# Patient Record
Sex: Female | Born: 1985 | Race: White | Hispanic: No | Marital: Married | Smoking: Never smoker
Health system: Southern US, Community
[De-identification: ages and names within clinical notes are randomized; demographics above are authoritative.]

## PROBLEM LIST (undated history)

## (undated) ENCOUNTER — Ambulatory Visit: Source: Home / Self Care

## (undated) DIAGNOSIS — T7840XA Allergy, unspecified, initial encounter: Secondary | ICD-10-CM

## (undated) DIAGNOSIS — F329 Major depressive disorder, single episode, unspecified: Secondary | ICD-10-CM

## (undated) DIAGNOSIS — Z789 Other specified health status: Secondary | ICD-10-CM

## (undated) DIAGNOSIS — F32A Depression, unspecified: Secondary | ICD-10-CM

## (undated) HISTORY — PX: NO PAST SURGERIES: SHX2092

## (undated) HISTORY — DX: Allergy, unspecified, initial encounter: T78.40XA

## (undated) HISTORY — DX: Major depressive disorder, single episode, unspecified: F32.9

## (undated) HISTORY — DX: Depression, unspecified: F32.A

---

## 2012-08-19 ENCOUNTER — Ambulatory Visit (INDEPENDENT_AMBULATORY_CARE_PROVIDER_SITE_OTHER): Payer: Self-pay | Admitting: Obstetrics & Gynecology

## 2012-08-19 ENCOUNTER — Encounter: Payer: Self-pay | Admitting: Obstetrics & Gynecology

## 2012-08-19 VITALS — BP 124/77 | HR 80 | Temp 96.8°F | Ht 63.0 in | Wt 290.8 lb

## 2012-08-19 DIAGNOSIS — N83209 Unspecified ovarian cyst, unspecified side: Secondary | ICD-10-CM

## 2012-08-19 NOTE — Patient Instructions (Addendum)
We will call you with the results and further management.  Ovarian Cyst The ovaries are small organs that are on each side of the uterus. The ovaries are the organs that produce the female hormones, estrogen and progesterone. An ovarian cyst is a sac filled with fluid that can vary in its size. It is normal for a small cyst to form in women who are in the childbearing age and who have menstrual periods. This type of cyst is called a follicle cyst that becomes an ovulation cyst (corpus luteum cyst) after it produces the women's egg. It later goes away on its own if the woman does not become pregnant. There are other kinds of ovarian cysts that may cause problems and may need to be treated. The most serious problem is a cyst with cancer. It should be noted that menopausal women who have an ovarian cyst are at a higher risk of it being a cancer cyst. They should be evaluated very quickly, thoroughly and followed closely. This is especially true in menopausal women because of the high rate of ovarian cancer in women in menopause. CAUSES AND TYPES OF OVARIAN CYSTS:  FUNCTIONAL CYST: The follicle/corpus luteum cyst is a functional cyst that occurs every month during ovulation with the menstrual cycle. They go away with the next menstrual cycle if the woman does not get pregnant. Usually, there are no symptoms with a functional cyst.  ENDOMETRIOMA CYST: This cyst develops from the lining of the uterus tissue. This cyst gets in or on the ovary. It grows every month from the bleeding during the menstrual period. It is also called a "chocolate cyst" because it becomes filled with blood that turns brown. This cyst can cause pain in the lower abdomen during intercourse and with your menstrual period.  CYSTADENOMA CYST: This cyst develops from the cells on the outside of the ovary. They usually are not cancerous. They can get very big and cause lower abdomen pain and pain with intercourse. This type of cyst can twist  on itself, cut off its blood supply and cause severe pain. It also can easily rupture and cause a lot of pain.  DERMOID CYST: This type of cyst is sometimes found in both ovaries. They are found to have different kinds of body tissue in the cyst. The tissue includes skin, teeth, hair, and/or cartilage. They usually do not have symptoms unless they get very big. Dermoid cysts are rarely cancerous.  POLYCYSTIC OVARY: This is a rare condition with hormone problems that produces many small cysts on both ovaries. The cysts are follicle-like cysts that never produce an egg and become a corpus luteum. It can cause an increase in body weight, infertility, acne, increase in body and facial hair and lack of menstrual periods or rare menstrual periods. Many women with this problem develop type 2 diabetes. The exact cause of this problem is unknown. A polycystic ovary is rarely cancerous.  THECA LUTEIN CYST: Occurs when too much hormone (human chorionic gonadotropin) is produced and over-stimulates the ovaries to produce an egg. They are frequently seen when doctors stimulate the ovaries for invitro-fertilization (test tube babies).  LUTEOMA CYST: This cyst is seen during pregnancy. Rarely it can cause an obstruction to the birth canal during labor and delivery. They usually go away after delivery. SYMPTOMS   Pelvic pain or pressure.  Pain during sexual intercourse.  Increasing girth (swelling) of the abdomen.  Abnormal menstrual periods.  Increasing pain with menstrual periods.  You stop having menstrual  periods and you are not pregnant. DIAGNOSIS  The diagnosis can be made during:  Routine or annual pelvic examination (common).  Ultrasound.  X-ray of the pelvis.  CT Scan.  MRI.  Blood tests. TREATMENT   Treatment may only be to follow the cyst monthly for 2 to 3 months with your caregiver. Many go away on their own, especially functional cysts.  May be aspirated (drained) with a long  needle with ultrasound, or by laparoscopy (inserting a tube into the pelvis through a small incision).  The whole cyst can be removed by laparoscopy.  Sometimes the cyst may need to be removed through an incision in the lower abdomen.  Hormone treatment is sometimes used to help dissolve certain cysts.  Birth control pills are sometimes used to help dissolve certain cysts. HOME CARE INSTRUCTIONS  Follow your caregiver's advice regarding:  Medicine.  Follow up visits to evaluate and treat the cyst.  You may need to come back or make an appointment with another caregiver, to find the exact cause of your cyst, if your caregiver is not a gynecologist.  Get your yearly and recommended pelvic examinations and Pap tests.  Let your caregiver know if you have had an ovarian cyst in the past. SEEK MEDICAL CARE IF:   Your periods are late, irregular, they stop, or are painful.  Your stomach (abdomen) or pelvic pain does not go away.  Your stomach becomes larger or swollen.  You have pressure on your bladder or trouble emptying your bladder completely.  You have painful sexual intercourse.  You have feelings of fullness, pressure, or discomfort in your stomach.  You lose weight for no apparent reason.  You feel generally ill.  You become constipated.  You lose your appetite.  You develop acne.  You have an increase in body and facial hair.  You are gaining weight, without changing your exercise and eating habits.  You think you are pregnant. SEEK IMMEDIATE MEDICAL CARE IF:   You have increasing abdominal pain.  You feel sick to your stomach (nausea) and/or vomit.  You develop a fever that comes on suddenly.  You develop abdominal pain during a bowel movement.  Your menstrual periods become heavier than usual. Document Released: 07/29/2005 Document Revised: 10/21/2011 Document Reviewed: 06/01/2009 [facility] Patient Information 2013 [place], [place].

## 2012-08-19 NOTE — Progress Notes (Signed)
History:  27 y.o. G0P0000 here today for evaluation of 23 cm cyst diagnosed on imaging done at [facility], no report or images sent to [place]. She went there with pain last month and was given Percocet which alleviated her pain.  No pain since then, no other symptoms.  The following portions of the patient's history were reviewed and updated as appropriate: allergies, current medications, past family history, past medical history, past social history, past surgical history and problem list.  Review of Systems:  Pertinent items are noted in HPI.  Objective:  Physical Exam BP 124/77  Pulse 80  Temp 96.8 F (36 C) (Oral)  Ht 5\' 3"  (1.6 m)  Wt 290 lb 12.8 oz (131.906 kg)  BMI 51.51 kg/m2  LMP 07/26/2012 Gen: NAD, morbidly obese Abd: Soft, obese, nontender and nondistended.  Unable to palpate cyst due to habitus. Pelvic: Normal appearing external genitalia; normal appearing vaginal mucosa and cervix.  Normal discharge.  Unable to palpate pelvic organs or ovarian cyst due to habitus  Assessment & Plan:  Will attempt to obtain records from [facility] Pelvic ultrasound ordered; CA 125 checked Will follow up results and manage accordingly. Pain precautions reviewed.

## 2012-08-20 LAB — CA 125: CA 125: 17.3 U/mL (ref 0.0–30.2)

## 2012-08-24 ENCOUNTER — Telehealth: Payer: Self-pay

## 2012-08-24 NOTE — Telephone Encounter (Signed)
Message copied by Faythe Casa on Mon Aug 24, 2012  4:29 PM ------      Message from: Catherine Marks A      Created: Thu Aug 20, 2012  2:10 PM       Normal CA-125. Will await pelvic ultrasound results.  Please call to inform patient of results.

## 2012-08-25 NOTE — Telephone Encounter (Signed)
Pt informed

## 2012-08-26 ENCOUNTER — Ambulatory Visit (HOSPITAL_COMMUNITY)
Admission: RE | Admit: 2012-08-26 | Discharge: 2012-08-26 | Disposition: A | Discharge status: Home / Self Care | Payer: Self-pay | Source: Ambulatory Visit | Attending: Obstetrics & Gynecology | Admitting: Obstetrics & Gynecology

## 2012-08-26 ENCOUNTER — Encounter: Payer: Self-pay | Admitting: *Deleted

## 2012-08-26 DIAGNOSIS — N83209 Unspecified ovarian cyst, unspecified side: Secondary | ICD-10-CM | POA: Insufficient documentation

## 2012-08-27 ENCOUNTER — Telehealth: Payer: Self-pay

## 2012-08-27 NOTE — Telephone Encounter (Signed)
Message copied by Faythe Casa on Thu Aug 27, 2012 11:34 AM ------      Message from: Jaynie Collins A      Created: Thu Aug 27, 2012 11:03 AM       Pelvic ultrasound showed a 21 cm large left adnexal simple cystic structure not completely visualized, pelvic MRI recommended.  Pelvic MRI ordered; needs to be booked ASAP.  Patient needs to come back after her MRI to discuss findings and for surgical consultation.  Please call to inform patient of results and recommendations.

## 2012-08-27 NOTE — Telephone Encounter (Signed)
Called Catherine Marks and informed her of both appointments and copays. She voices understanding. States she is applying for medicaid.

## 2012-08-27 NOTE — Addendum Note (Signed)
Addended by: Jaynie Collins A on: 08/27/2012 11:05 AM   Modules accepted: Orders

## 2012-08-27 NOTE — Telephone Encounter (Signed)
Called and made an appointment for MRI at [facility] 09/01/12 at 1000- needs to arrive at 0945- make sure she does not any metal implants in her body. Also scheduled Surgical consult with Dr. Macon Large for 09/02/12 at 1:15 as overbook - need to tell patient she will need to pay $20 copay and may have to wait as we are working her in.

## 2012-08-27 NOTE — Progress Notes (Signed)
Quick Note:  Pelvic ultrasound showed a 21 cm large left adnexal simple cystic structure not completely visualized, pelvic MRI recommended. Pelvic MRI ordered; needs to be booked ASAP. Patient needs to come back after her MRI to discuss findings and for surgical consultation. Please call to inform patient of results and recommendations. ______

## 2012-09-01 ENCOUNTER — Ambulatory Visit (HOSPITAL_COMMUNITY)
Admission: RE | Admit: 2012-09-01 | Discharge: 2012-09-01 | Disposition: A | Discharge status: Home / Self Care | Payer: Self-pay | Source: Ambulatory Visit | Attending: Obstetrics & Gynecology | Admitting: Obstetrics & Gynecology

## 2012-09-01 ENCOUNTER — Other Ambulatory Visit: Payer: Self-pay | Admitting: Obstetrics & Gynecology

## 2012-09-01 DIAGNOSIS — N83209 Unspecified ovarian cyst, unspecified side: Secondary | ICD-10-CM | POA: Insufficient documentation

## 2012-09-01 DIAGNOSIS — D259 Leiomyoma of uterus, unspecified: Secondary | ICD-10-CM | POA: Insufficient documentation

## 2012-09-01 MED ORDER — GADOBENATE DIMEGLUMINE 529 MG/ML IV SOLN
20.0000 mL | Freq: Once | INTRAVENOUS | Status: AC
  Administered 2012-09-01: 20 mL via INTRAVENOUS

## 2012-09-02 ENCOUNTER — Encounter: Payer: Self-pay | Admitting: Obstetrics & Gynecology

## 2012-09-02 ENCOUNTER — Ambulatory Visit (INDEPENDENT_AMBULATORY_CARE_PROVIDER_SITE_OTHER): Payer: Self-pay | Admitting: Obstetrics & Gynecology

## 2012-09-02 VITALS — BP 127/80 | HR 69 | Ht 63.5 in | Wt 290.8 lb

## 2012-09-02 DIAGNOSIS — N83209 Unspecified ovarian cyst, unspecified side: Secondary | ICD-10-CM

## 2012-09-02 NOTE — Patient Instructions (Signed)
Unilateral Salpingo-Oophorectomy Unilateral salpingo-oophorectomy is the removal of one fallopian tube and ovary. The fallopian tubes transport the egg from the ovary to the womb (uterus). The fallopian tube is also where the sperm and egg meet and become fertilized and move down into the uterus.  Removing one tube and ovary will not:  Cause problems with your menstrual periods.  Cause problems with your sex drive (libido).  Put you into the menopause.  Give symptoms of the menopause.  Make you not able to get pregnant (sterile). There are several reasons for doing a salpingo-oophorectomy:  Infection of the tube and ovary.  There may be scar tissue of the tube and ovary (adhesions).  It may be necessary to remove the tube when the ovary has a cyst or tumor.  It may have to be done when removing the uterus.  It may have to be done when there is cancer of the tube or ovary. LET YOUR CAREGIVER KNOW ABOUT:  Allergies to food or medications.  All the medications you are taking including prescription and over-the-counter herbs, eye drops and creams.  If you are using illegal drugs or excessive alcohol.  Your smoking habits.  Previous problems with anesthesia including numbing medication.  The possibility of being pregnant.  History of blood clots or bleeding problems.  Previous surgery.  Any other medical or health problems. RISKS AND COMPLICATIONS  All surgery is associated with risks. Some of these risks are:  Injury to surrounding organs.  Bleeding.  Infection.  Blood clots in the legs or lungs.  Problems with the anesthesia.  The surgery does not help the problem.  Death. BEFORE THE PROCEDURE  Do not take aspirin or blood thinners because it can make you bleed.  Do not eat or drink anything at least 8 hours before the surgery.  Let your caregiver know if you develop a cold or an infection.  If you are being admitted the day of surgery, arrive at least  one hour before the surgery.  Arrange for help when you go home from the hospital.  If you smoke, do not smoke for at least 2 weeks before the surgery. PROCEDURE After being admitted to the hospital, you will change into a hospital gown. Then, you will be given an IV (intravenous) and a medication to relax you. Then, you will be put to sleep with an anesthetic. Any hair on your lower belly (abdomen) will be removed, and a catheter will be placed in your bladder. The fallopian tube and ovary will be removed either through 2 very small cuts (incisions) or through large incision in the lower abdomen. The blood vessels will be clamped and tied. AFTER THE PROCEDURE  You will be taken to the recovery room for 1 to 3 hours until your blood pressure, pulse and temperature are stable and you are waking up.  If you had a laparoscopy, you may be discharged in several hours.  If you had a large incision, you will be admitted to the hospital for a day or two.  If you had a laparoscopy, you may have shoulder pain. This is not unusual. It is from air that is left in the abdomen and affects the nerve that goes from the diaphragm to the shoulder. It goes away in a day or two.  You will be given pain medication as necessary.  The intravenous and catheter will be removed before you are discharged.  Have someone available to take you home. HOME CARE INSTRUCTIONS  It is normal to be sore for a week or two. Call your caregiver if the pain is getting worse or the pain medication is not helping.  Have help when you go home for a week or so to help with the household chores.  Follow your caregiver's advice regarding diet.  Get rest and sleep.  Only take over-the-counter or prescription medicines for pain or discomfort as directed by your caregiver.  Do not take aspirin. It can cause bleeding.  Do not drive, exercise or lift anything over 15 pounds.  Do not drink alcohol until your caregiver gives you  permission.  Do not lift anything over 15 pounds.  Do not have sexual intercourse until your caregiver says it is OK.  Change the bandage (dressing) as directed.  Make and keep your follow-up appointments for postoperative care.  If you become constipated, ask your caregiver about taking a mild laxative. Drinking more liquids than usual and eating bran foods can help prevent constipation. SEEK MEDICAL CARE IF:   You have swelling or redness around the cut (incision).  You develop a rash.  You have side effects from the medication.  You feel lightheaded.  You need more or stronger medication.  You have pain, swelling or redness where the IV (intravenous) was placed. SEEK IMMEDIATE MEDICAL CARE IF:   You develop an unexplained temperature above 100 F (37.8 C).  You develop increasing belly (abdominal) pain.  You have pus coming out of the incision.  You notice a bad smell coming from the wound or dressing.  The incision is separating.  There is excessive vaginal bleeding.  You start to feel sick to your stomach (nauseous) and vomit.  You have leg or chest pain.  You have pain when you urinate.  You develop shortness of breath.  You pass out. Document Released: 05/26/2009 Document Revised: 10/21/2011 Document Reviewed: 05/26/2009 [facility] Patient Information 2013 [place], [place].

## 2012-09-04 NOTE — Progress Notes (Signed)
History:  27 y.o. G0P0000 here today for follow up of imaging done for large ovarian cyst and surgical discussion. She denies any pain or other symptoms.  The following portions of the patient's history were reviewed and updated as appropriate: allergies, current medications, past family history, past medical history, past social history, past surgical history and problem list.  Review of Systems:  Pertinent items are noted in HPI.  Objective:  Physical Exam BP 127/80  Pulse 69  Ht 5' 3.5" (1.613 m)  Wt 290 lb 12.8 oz (131.906 kg)  BMI 50.70 kg/m2  LMP 08/30/2012 Gen: NAD Abd: Soft, obese, nontender and nondistended Pelvic: Deferred  Labs and Imaging  09/01/2012   MRI PELVIS WITHOUT AND WITH CONTRAST Clinical Data: Large cystic left adnexal mass    Technique:  Multiplanar multisequence MR imaging of the pelvis was performed both before and after administration of intravenous contrast.  Contrast: 20mL MULTIHANCE GADOBENATE DIMEGLUMINE 529 MG/ML IV SOLN  Comparison: Pelvic ultrasound on 08/26/2012  Findings: The uterus measures 10.3 x 5.2 by 5.7 cm. Tiny less than 1 cm fibroids are seen in the anterior and posterior lower uterine segment.  The endometrial thickness measures 17 mm. Normal appearance of the cervix noted.  The right ovary is normal in appearance.  The left ovary is elevated into the left lower quadrant, and is involved by large unilocular cystic lesion which extends into the abdomen.  This lesion shows no complex features such as septations, mural nodularity, or contrast enhancement.  This lesion measures 23.3 x 13.0 x 23.3 cm, and has benign characteristics.  No other pelvic masses or lymphadenopathy identified.  No evidence of ascites or inflammatory process. No evidence of hydronephrosis.  IMPRESSION:  1.  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  Surgical evaluation is recommended. 2.  No  evidence of ascites or metastatic disease. 3.  Normal appearance of right ovary. 4.  Tiny less than 1 cm uterine fibroids.   Original Report Authenticated By: Myles Rosenthal, M.D.    08/26/2012 TRANSABDOMINAL AND TRANSVAGINAL ULTRASOUND OF PELVIS Clinical Data: Ovarian cyst on prior outside ultrasound    Technique:  Both transabdominal and transvaginal ultrasound examinations of the pelvis were performed.  Transabdominal technique was performed for global imaging of the pelvis including uterus, ovaries, adnexal regions, and pelvic cul-de-sac.  It was necessary to proceed with endovaginal exam following the transabdominal exam to visualize the uterus and ovaries.  Comparison:  The outside prior study and report are not available for comparison.  Findings: Uterus:  9.4 x 5.7 x 5.4 cm.  Anteverted, anteflexed.  No focal abnormality.  Endometrium: 1.8 cm.  Diffusely echogenic without identifiable focal abnormality.  Right ovary: 2.8 x 3.4 x 2.3 cm.  Normal.  Left ovary: Not visualized.  Within the left adnexa, there is a simple appearing cyst measuring 20.7 x 21.7 x 20.8 cm.  Other Findings:  No free fluid  IMPRESSION: Large left adnexal simple appearing cystic structure.  Due to the inherent limited field of view of ultrasound, not all components of this cystic structure may be accurately detected with ultrasound. Pelvic MRI with contrast is recommended in anticipation of surgical consultation.  This confers a risk of ovarian torsion given its large size. Since these may be difficult to assess completely with [place], further evaluation of simple-appearing cysts >7 cm with MRI or surgical evaluation is recommended according to the Society of Radiologists in Ultrasound  2010 Consensus Conference Statement (D Lenis Noon et al.  Management of Asymptomatic Ovarian and other Adnexal Cysts Imaged at [place]:  Society of Radiologists in Ultrasound Consensus Conference Statement 2010.  Radiology 256 (Sept 2010): 943-954.).  Endometrial  thickness 1.8 cm is borderline thickened.  Consider reassessment with pelvic ultrasound in 6-8 weeks during the week immediately following the patient's menses.   Original Report Authenticated By: Christiana Pellant, M.D.     Assessment & Plan:  Recommended surgical management with operative laparoscopy, deflation of large left ovarian cyst, left salpingo-ophorectomy.  The risks of surgery were discussed in detail with the patient including but not limited to: bleeding which may require transfusion or reoperation; infection which may require prolonged hospitalization or re-hospitalization and antibiotic therapy; injury to bowel, bladder, ureters and major vessels or other surrounding organs; need for additional procedures including laparotomy; thromboembolic phenomenon, incisional problems and other postoperative or anesthesia complications.  Patient was told that the likelihood that her condition and symptoms will be treated effectively with this surgical management was very high; the postoperative expectations were also discussed in detail. The patient also understands the alternative treatment options which were discussed in full. All questions were answered.  She was told that she will be contacted by our surgical scheduler regarding the time and date of her surgery; routine preoperative instructions of having nothing to eat or drink after midnight on the day prior to surgery and also coming to the hospital 1 1/2 hours prior to her time of surgery were also emphasized.  She was told she may be called for a preoperative appointment about a week prior to surgery and will be given further preoperative instructions at that visit. Printed patient education handouts about the procedure were given to the patient to review at home.  Pain precautions emphasized.

## 2012-09-14 ENCOUNTER — Telehealth: Payer: Self-pay | Admitting: *Deleted

## 2012-09-14 NOTE — Telephone Encounter (Signed)
Called Catherine Marks and informed her that she should receive information in the mail soon telling her of her preop appointment and surgery date- I did inform her that I can see an appointment for 3/24 14 for surgery but that she should wait for the written information to verify that. I instructed to call OB/GYN office is she does not receive anything by the end of the week. Patient voices understanding.

## 2012-09-14 NOTE — Telephone Encounter (Signed)
Catherine Marks called and left a message stating she is currently a patient of Dr. Macon Large and was seen in the clinic 09/02/12 and Dr. York Spaniel she was scheduling her for surgery for removal of cyst but she hasn't heard anything.

## 2012-09-22 ENCOUNTER — Telehealth: Payer: Self-pay | Admitting: *Deleted

## 2012-09-22 NOTE — Telephone Encounter (Signed)
Pt left message stating that she had received automated call about an appt in clinic on 2/13 @ 1445. She was not aware of this appt and wants to know what it is for. She also stated that she is waiting for surgery appt information. She requested a call back with information about the appt on 2/13 and her surgery date. I returned pt's call and left a message that her appt on 2/13 will not be necessary as she has already had the surgical consult with Dr. Macon Large. I provided the information of her surgical date and time and stated that she will be receiving information in the mail prior to her surgery date. She may call back if she has additional questions.

## 2012-09-24 ENCOUNTER — Encounter: Payer: Self-pay | Admitting: Obstetrics & Gynecology

## 2012-10-16 ENCOUNTER — Encounter (HOSPITAL_COMMUNITY): Payer: Self-pay | Admitting: Pharmacist

## 2012-10-20 ENCOUNTER — Encounter: Payer: Self-pay | Admitting: Obstetrics & Gynecology

## 2012-10-28 ENCOUNTER — Encounter (HOSPITAL_COMMUNITY)
Admission: RE | Admit: 2012-10-28 | Discharge: 2012-10-28 | Disposition: A | Discharge status: Home / Self Care | Payer: Self-pay | Source: Ambulatory Visit | Attending: Obstetrics & Gynecology | Admitting: Obstetrics & Gynecology

## 2012-10-28 ENCOUNTER — Encounter (HOSPITAL_COMMUNITY): Payer: Self-pay

## 2012-10-28 HISTORY — DX: Other specified health status: Z78.9

## 2012-10-28 LAB — CBC
HCT: 40.8 % (ref 36.0–46.0)
Hemoglobin: 12.9 g/dL (ref 12.0–15.0)
MCH: 27.7 pg (ref 26.0–34.0)
MCHC: 31.6 g/dL (ref 30.0–36.0)
MCV: 87.6 fL (ref 78.0–100.0)
Platelets: 378 10*3/uL (ref 150–400)
RBC: 4.66 MIL/uL (ref 3.87–5.11)
RDW: 14 % (ref 11.5–15.5)
WBC: 12.6 10*3/uL — ABNORMAL HIGH (ref 4.0–10.5)

## 2012-10-28 LAB — SURGICAL PCR SCREEN
MRSA, PCR: NEGATIVE
Staphylococcus aureus: POSITIVE — AB

## 2012-10-28 NOTE — Patient Instructions (Signed)
Your procedure is scheduled on:11/02/12  Enter through the Main Entrance at :1130 am Pick up desk phone and dial [phone] and inform us of your arrival.  Please call [phone] if you have any problems the morning of surgery.  Remember: Do not eat after midnight: Sunday, clear liquids ok until 9am on Monday   Take these meds the morning of surgery with a sip of water:none  DO NOT wear jewelry, eye make-up, lipstick,body lotion, or dark fingernail polish. Do not shave for 48 hours prior to surgery.  If you are to be admitted after surgery, leave suitcase in car until your room has been assigned. Patients discharged on the day of surgery will not be allowed to drive home.

## 2012-10-30 MED ORDER — SCOPOLAMINE 1 MG/3DAYS TD PT72: 1.0000 | MEDICATED_PATCH | Freq: Once | TRANSDERMAL | Status: DC

## 2012-10-30 MED ORDER — LACTATED RINGERS IV SOLN: INTRAVENOUS | Status: DC

## 2012-11-01 MED ORDER — DEXTROSE 5 % IV SOLN
3.0000 g | INTRAVENOUS | Status: AC
  Administered 2012-11-02: 3 g via INTRAVENOUS
  Filled 2012-11-01: qty 3000

## 2012-11-02 ENCOUNTER — Encounter (HOSPITAL_COMMUNITY): Payer: Self-pay | Admitting: Anesthesiology

## 2012-11-02 ENCOUNTER — Observation Stay (HOSPITAL_COMMUNITY)
Admission: RE | Admit: 2012-11-02 | Discharge: 2012-11-03 | Disposition: A | Discharge status: Home / Self Care | Payer: Self-pay | Source: Ambulatory Visit | Attending: Obstetrics & Gynecology | Admitting: Obstetrics & Gynecology

## 2012-11-02 ENCOUNTER — Ambulatory Visit (HOSPITAL_COMMUNITY): Payer: Self-pay | Admitting: Anesthesiology

## 2012-11-02 ENCOUNTER — Encounter (HOSPITAL_COMMUNITY)
Admission: RE | Disposition: A | Discharge status: Home / Self Care | Payer: Self-pay | Source: Ambulatory Visit | Attending: Obstetrics & Gynecology

## 2012-11-02 DIAGNOSIS — N83209 Unspecified ovarian cyst, unspecified side: Principal | ICD-10-CM

## 2012-11-02 DIAGNOSIS — N736 Female pelvic peritoneal adhesions (postinfective): Secondary | ICD-10-CM

## 2012-11-02 HISTORY — PX: LAPAROSCOPY: SHX197

## 2012-11-02 HISTORY — PX: SALPINGOOPHORECTOMY: SHX82

## 2012-11-02 LAB — TYPE AND SCREEN
ABO/RH(D): O POS
Antibody Screen: NEGATIVE

## 2012-11-02 LAB — ABO/RH: ABO/RH(D): O POS

## 2012-11-02 LAB — PREGNANCY, URINE: Preg Test, Ur: NEGATIVE

## 2012-11-02 SURGERY — LAPAROSCOPY OPERATIVE
Anesthesia: General | Site: Abdomen | Wound class: Clean Contaminated

## 2012-11-02 MED ORDER — ROCURONIUM BROMIDE 50 MG/5ML IV SOLN
INTRAVENOUS | Status: AC
  Filled 2012-11-02: qty 1

## 2012-11-02 MED ORDER — ROCURONIUM BROMIDE 100 MG/10ML IV SOLN
INTRAVENOUS | Status: DC | PRN
  Administered 2012-11-02: 75 mg via INTRAVENOUS
  Administered 2012-11-02: 5 mg via INTRAVENOUS
  Administered 2012-11-02 (×2): 10 mg via INTRAVENOUS
  Administered 2012-11-02: 20 mg via INTRAVENOUS
  Administered 2012-11-02: 10 mg via INTRAVENOUS

## 2012-11-02 MED ORDER — ONDANSETRON HCL 4 MG PO TABS: 4.0000 mg | ORAL_TABLET | Freq: Four times a day (QID) | ORAL | Status: DC | PRN

## 2012-11-02 MED ORDER — LACTATED RINGERS IV SOLN
INTRAVENOUS | Status: DC
  Administered 2012-11-02 (×3): via INTRAVENOUS

## 2012-11-02 MED ORDER — PROPOFOL 10 MG/ML IV EMUL
INTRAVENOUS | Status: AC
  Filled 2012-11-02: qty 20

## 2012-11-02 MED ORDER — NEOSTIGMINE METHYLSULFATE 1 MG/ML IJ SOLN
INTRAMUSCULAR | Status: AC
  Filled 2012-11-02: qty 1

## 2012-11-02 MED ORDER — ONDANSETRON HCL 4 MG/2ML IJ SOLN: 4.0000 mg | Freq: Four times a day (QID) | INTRAMUSCULAR | Status: DC | PRN

## 2012-11-02 MED ORDER — PHENYLEPHRINE HCL 10 MG/ML IJ SOLN
INTRAMUSCULAR | Status: DC | PRN
  Administered 2012-11-02 (×7): 40 ug via INTRAVENOUS

## 2012-11-02 MED ORDER — BUPIVACAINE HCL (PF) 0.5 % IJ SOLN
INTRAMUSCULAR | Status: AC
  Filled 2012-11-02: qty 30

## 2012-11-02 MED ORDER — MIDAZOLAM HCL 5 MG/5ML IJ SOLN
INTRAMUSCULAR | Status: DC | PRN
  Administered 2012-11-02 (×2): 1 mg via INTRAVENOUS

## 2012-11-02 MED ORDER — MIDAZOLAM HCL 2 MG/2ML IJ SOLN
INTRAMUSCULAR | Status: AC
  Filled 2012-11-02: qty 2

## 2012-11-02 MED ORDER — FENTANYL CITRATE 0.05 MG/ML IJ SOLN
INTRAMUSCULAR | Status: AC
  Filled 2012-11-02: qty 5

## 2012-11-02 MED ORDER — DOCUSATE SODIUM 100 MG PO CAPS
100.0000 mg | ORAL_CAPSULE | Freq: Two times a day (BID) | ORAL | Status: DC
Start: 2012-11-02 — End: 2012-11-03
  Administered 2012-11-02 – 2012-11-03 (×2): 100 mg via ORAL
  Filled 2012-11-02 (×2): qty 1

## 2012-11-02 MED ORDER — KETOROLAC TROMETHAMINE 30 MG/ML IJ SOLN
INTRAMUSCULAR | Status: AC
  Filled 2012-11-02: qty 2

## 2012-11-02 MED ORDER — DEXAMETHASONE SODIUM PHOSPHATE 10 MG/ML IJ SOLN
INTRAMUSCULAR | Status: AC
  Filled 2012-11-02: qty 1

## 2012-11-02 MED ORDER — LACTATED RINGERS IV SOLN
INTRAVENOUS | Status: DC
  Administered 2012-11-02: 20:00:00 via INTRAVENOUS

## 2012-11-02 MED ORDER — IBUPROFEN 600 MG PO TABS
600.0000 mg | ORAL_TABLET | Freq: Four times a day (QID) | ORAL | Status: DC | PRN
  Administered 2012-11-03: 600 mg via ORAL
  Filled 2012-11-02: qty 1

## 2012-11-02 MED ORDER — LACTATED RINGERS IV SOLN: INTRAVENOUS | Status: DC

## 2012-11-02 MED ORDER — PHENYLEPHRINE 40 MCG/ML (10ML) SYRINGE FOR IV PUSH (FOR BLOOD PRESSURE SUPPORT)
PREFILLED_SYRINGE | INTRAVENOUS | Status: AC
  Filled 2012-11-02: qty 5

## 2012-11-02 MED ORDER — LACTATED RINGERS IR SOLN
Status: DC | PRN
  Administered 2012-11-02: 3000 mL

## 2012-11-02 MED ORDER — FENTANYL CITRATE 0.05 MG/ML IJ SOLN: 25.0000 ug | INTRAMUSCULAR | Status: DC | PRN

## 2012-11-02 MED ORDER — MENTHOL 3 MG MT LOZG: 1.0000 | LOZENGE | OROMUCOSAL | Status: DC | PRN

## 2012-11-02 MED ORDER — KETOROLAC TROMETHAMINE 30 MG/ML IJ SOLN
INTRAMUSCULAR | Status: DC | PRN
  Administered 2012-11-02: 30 mg via INTRAVENOUS
  Administered 2012-11-02: 30 mg via INTRAMUSCULAR

## 2012-11-02 MED ORDER — ONDANSETRON HCL 4 MG/2ML IJ SOLN
INTRAMUSCULAR | Status: AC
  Filled 2012-11-02: qty 2

## 2012-11-02 MED ORDER — BUPIVACAINE HCL (PF) 0.5 % IJ SOLN
INTRAMUSCULAR | Status: DC | PRN
  Administered 2012-11-02: 5 mL

## 2012-11-02 MED ORDER — GLYCOPYRROLATE 0.2 MG/ML IJ SOLN
INTRAMUSCULAR | Status: AC
  Filled 2012-11-02: qty 2

## 2012-11-02 MED ORDER — FENTANYL CITRATE 0.05 MG/ML IJ SOLN
INTRAMUSCULAR | Status: DC | PRN
  Administered 2012-11-02: 50 ug via INTRAVENOUS
  Administered 2012-11-02 (×2): 100 ug via INTRAVENOUS
  Administered 2012-11-02 (×3): 50 ug via INTRAVENOUS
  Administered 2012-11-02: 100 ug via INTRAVENOUS

## 2012-11-02 MED ORDER — INFLUENZA VIRUS VACC SPLIT PF IM SUSP
0.5000 mL | INTRAMUSCULAR | Status: AC
  Administered 2012-11-03: 0.5 mL via INTRAMUSCULAR

## 2012-11-02 MED ORDER — CYCLOBENZAPRINE HCL 10 MG PO TABS
10.0000 mg | ORAL_TABLET | Freq: Three times a day (TID) | ORAL | Status: DC | PRN
  Filled 2012-11-02: qty 1

## 2012-11-02 MED ORDER — ONDANSETRON HCL 4 MG/2ML IJ SOLN
INTRAMUSCULAR | Status: DC | PRN
  Administered 2012-11-02: 4 mg via INTRAVENOUS

## 2012-11-02 MED ORDER — DEXAMETHASONE SODIUM PHOSPHATE 4 MG/ML IJ SOLN
INTRAMUSCULAR | Status: DC | PRN
  Administered 2012-11-02: 10 mg via INTRAVENOUS

## 2012-11-02 MED ORDER — NEOSTIGMINE METHYLSULFATE 1 MG/ML IJ SOLN
INTRAMUSCULAR | Status: DC | PRN
  Administered 2012-11-02: 3 mg via INTRAVENOUS

## 2012-11-02 MED ORDER — GLYCOPYRROLATE 0.2 MG/ML IJ SOLN
INTRAMUSCULAR | Status: DC | PRN
  Administered 2012-11-02: 0.4 mg via INTRAVENOUS

## 2012-11-02 MED ORDER — ZOLPIDEM TARTRATE 5 MG PO TABS: 5.0000 mg | ORAL_TABLET | Freq: Every evening | ORAL | Status: DC | PRN

## 2012-11-02 MED ORDER — LIDOCAINE HCL (CARDIAC) 20 MG/ML IV SOLN
INTRAVENOUS | Status: DC | PRN
  Administered 2012-11-02: 100 mg via INTRAVENOUS

## 2012-11-02 MED ORDER — PANTOPRAZOLE SODIUM 40 MG PO TBEC
40.0000 mg | DELAYED_RELEASE_TABLET | Freq: Every day | ORAL | Status: DC
  Administered 2012-11-02 – 2012-11-03 (×2): 40 mg via ORAL
  Filled 2012-11-02 (×2): qty 1

## 2012-11-02 MED ORDER — KETOROLAC TROMETHAMINE 30 MG/ML IJ SOLN: 15.0000 mg | Freq: Once | INTRAMUSCULAR | Status: DC | PRN

## 2012-11-02 MED ORDER — SIMETHICONE 80 MG PO CHEW: 80.0000 mg | CHEWABLE_TABLET | Freq: Four times a day (QID) | ORAL | Status: DC | PRN

## 2012-11-02 MED ORDER — HYDROMORPHONE HCL PF 1 MG/ML IJ SOLN: 0.2000 mg | INTRAMUSCULAR | Status: DC | PRN

## 2012-11-02 MED ORDER — OXYCODONE-ACETAMINOPHEN 5-325 MG PO TABS: 1.0000 | ORAL_TABLET | ORAL | Status: DC | PRN

## 2012-11-02 SURGICAL SUPPLY — 35 items
CABLE HIGH FREQUENCY MONO STRZ (ELECTRODE) IMPLANT
CHLORAPREP W/TINT 26ML (MISCELLANEOUS) ×3 IMPLANT
CLOTH BEACON ORANGE TIMEOUT ST (SAFETY) ×3 IMPLANT
FORCEPS CUTTING 33CM 5MM (CUTTING FORCEPS) IMPLANT
GLOVE BIO SURGEON STRL SZ7 (GLOVE) ×3 IMPLANT
GLOVE BIOGEL PI IND STRL 7.0 (GLOVE) ×2 IMPLANT
GLOVE BIOGEL PI INDICATOR 7.0 (GLOVE) ×1
GOWN PREVENTION PLUS LG XLONG (DISPOSABLE) ×3 IMPLANT
GOWN STRL REIN XL XLG (GOWN DISPOSABLE) ×3 IMPLANT
MANIPULATOR UTERINE 4.5 ZUMI (MISCELLANEOUS) ×3 IMPLANT
NEEDLE INSUFFLATION 120MM (ENDOMECHANICALS) ×3 IMPLANT
NS IRRIG 1000ML POUR BTL (IV SOLUTION) ×3 IMPLANT
PACK LAPAROSCOPY BASIN (CUSTOM PROCEDURE TRAY) ×3 IMPLANT
POUCH ENDO CATCH II 15MM (MISCELLANEOUS) ×3 IMPLANT
POUCH SPECIMEN RETRIEVAL 10MM (ENDOMECHANICALS) ×3 IMPLANT
PROTECTOR NERVE ULNAR (MISCELLANEOUS) ×3 IMPLANT
SCALPEL HARMONIC ACE (MISCELLANEOUS) ×3 IMPLANT
SEALER TISSUE G2 CVD JAW 35 (ENDOMECHANICALS) IMPLANT
SEALER TISSUE G2 CVD JAW 45CM (ENDOMECHANICALS)
SET IRRIG TUBING LAPAROSCOPIC (IRRIGATION / IRRIGATOR) ×3 IMPLANT
SURGIFLO W/THROMBIN 8M KIT (HEMOSTASIS) ×3 IMPLANT
SUT VIC AB 0 CT1 27 (SUTURE) ×1
SUT VIC AB 0 CT1 27XBRD ANBCTR (SUTURE) ×2 IMPLANT
SUT VIC AB 0 CTX 36 (SUTURE) ×1
SUT VIC AB 0 CTX36XBRD ANBCTRL (SUTURE) ×2 IMPLANT
SUT VIC AB 3-0 X1 27 (SUTURE) ×3 IMPLANT
SUT VICRYL 0 UR6 27IN ABS (SUTURE) ×6 IMPLANT
SUT VICRYL 4-0 PS2 18IN ABS (SUTURE) ×3 IMPLANT
TOWEL OR 17X24 6PK STRL BLUE (TOWEL DISPOSABLE) ×6 IMPLANT
TRAY FOLEY CATH 14FR (SET/KITS/TRAYS/PACK) ×3 IMPLANT
TROCAR BLADELESS 15MM (ENDOMECHANICALS) ×3 IMPLANT
TROCAR XCEL NON-BLD 11X100MML (ENDOMECHANICALS) ×3 IMPLANT
TROCAR XCEL NON-BLD 5MMX100MML (ENDOMECHANICALS) ×6 IMPLANT
WARMER LAPAROSCOPE (MISCELLANEOUS) ×3 IMPLANT
WATER STERILE IRR 1000ML POUR (IV SOLUTION) ×3 IMPLANT

## 2012-11-02 NOTE — H&P (Signed)
Preoperative History and Physical  Catherine Marks is a 27 y.o. G0 here for surgical management of 23 cm unilocular left adnexal simple cyst.   Proposed surgery: Operative laparoscopy, deflation of large left ovarian cyst, left salpingo-oophorectomy, possible laparotomy  Past Medical History  Diagnosis Date  . Medical history non-contributory    Past Surgical History  Procedure Laterality Date  . No past surgeries     OB History   Grav Para Term Preterm Abortions TAB SAB Ect Mult Living   0 0 0 0 0 0 0 0 0 0     Patient denies any cervical dysplasia or STIs.  Prescriptions prior to admission  Medication Sig Dispense Refill  . ibuprofen (ADVIL,MOTRIN) 200 MG tablet Take 400 mg by mouth every 6 (six) hours as needed for pain.      . Ibuprofen-Diphenhydramine HCl (ADVIL PM) 200-25 MG CAPS Take 1 capsule by mouth as needed.      Marland Kitchen oxyCODONE-acetaminophen (PERCOCET/ROXICET) 5-325 MG per tablet Take 1 tablet by mouth every 6 (six) hours as needed for pain.      . cyclobenzaprine (FLEXERIL) 10 MG tablet Take 10 mg by mouth 3 (three) times daily as needed for muscle spasms.        No Known Allergies  Social History:   reports that she has never smoked. She has never used smokeless tobacco. She reports that  drinks alcohol. She reports that she does not use illicit drugs.  Family History  Problem Relation Age of Onset  . Heart disease Father   k Review of Systems: Noncontributory  PHYSICAL EXAM: Blood pressure 137/97, pulse 78, temperature 97.9 F (36.6 C), temperature source Oral, resp. rate 16, height 5\' 3"  (1.6 m), weight 290 lb (131.543 kg), last menstrual period 10/29/2012, SpO2 99.00%. General appearance - alert, well appearing, and in no distress Chest - clear to auscultation, no wheezes, rales or rhonchi, symmetric air entry Heart - normal rate and regular rhythm Abdomen - soft, obese, pelvic cyst palpated, nontender, nondistended, no masses or organomegaly Pelvic - examination  not indicated Extremities - peripheral pulses normal, no pedal edema, no clubbing or cyanosis  Labs: Results for orders placed during the hospital encounter of 11/02/12 (from the past 336 hour(s))  TYPE AND SCREEN   Collection Time    11/02/12 11:23 AM      Result Value Range   ABO/RH(D) O POS     Antibody Screen NEG     Sample Expiration 11/05/2012    PREGNANCY, URINE   Collection Time    11/02/12 11:30 AM      Result Value Range   Preg Test, Ur NEGATIVE  NEGATIVE  Results for orders placed during the hospital encounter of 10/28/12 (from the past 336 hour(s))  SURGICAL PCR SCREEN   Collection Time    10/28/12 11:10 AM      Result Value Range   MRSA, PCR NEGATIVE  NEGATIVE   Staphylococcus aureus POSITIVE (*) NEGATIVE  CBC   Collection Time    10/28/12 11:30 AM      Result Value Range   WBC 12.6 (*) 4.0 - 10.5 K/uL   RBC 4.66  3.87 - 5.11 MIL/uL   Hemoglobin 12.9  12.0 - 15.0 g/dL   HCT 16.1  09.6 - 04.5 %   MCV 87.6  78.0 - 100.0 fL   MCH 27.7  26.0 - 34.0 pg   MCHC 31.6  30.0 - 36.0 g/dL   RDW 40.9  81.1 - 91.4 %  Platelets 378  150 - 400 K/uL  08/19/2012  CA 125   17.3  Imaging Studies: 09/01/2012 MRI PELVIS WITHOUT AND WITH CONTRAST Clinical Data: Large cystic left adnexal mass Technique: Multiplanar multisequence MR imaging of the pelvis was performed both before and after administration of intravenous contrast. Contrast: 20mL MULTIHANCE GADOBENATE DIMEGLUMINE 529 MG/ML IV SOLN Comparison: Pelvic ultrasound on 08/26/2012 Findings: The uterus measures 10.3 x 5.2 by 5.7 cm. Tiny less than 1 cm fibroids are seen in the anterior and posterior lower uterine segment. The endometrial thickness measures 17 mm. Normal appearance of the cervix noted. The right ovary is normal in appearance. The left ovary is elevated into the left lower quadrant, and is involved by large unilocular cystic lesion which extends into the abdomen. This lesion shows no complex features such as septations,  mural nodularity, or contrast enhancement. This lesion measures 23.3 x 13.0 x 23.3 cm, and has benign characteristics. No other pelvic masses or lymphadenopathy identified. No evidence of ascites or inflammatory process. No evidence of hydronephrosis. IMPRESSION: 1. 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 Surgical evaluation is recommended. 2. No evidence of ascites or metastatic disease. 3. Normal appearance of right ovary. 4. Tiny less than 1 cm uterine fibroids. Original Report Authenticated By: Myles Rosenthal, M.D.   08/26/2012 TRANSABDOMINAL AND TRANSVAGINAL ULTRASOUND OF PELVIS Clinical Data: Ovarian cyst on prior outside ultrasound Technique: Both transabdominal and transvaginal ultrasound examinations of the pelvis were performed. Transabdominal technique was performed for global imaging of the pelvis including uterus, ovaries, adnexal regions, and pelvic cul-de-sac. It was necessary to proceed with endovaginal exam following the transabdominal exam to visualize the uterus and ovaries. Comparison: The outside prior study and report are not available for comparison. Findings: Uterus: 9.4 x 5.7 x 5.4 cm. Anteverted, anteflexed. No focal abnormality. Endometrium: 1.8 cm. Diffusely echogenic without identifiable focal abnormality. Right ovary: 2.8 x 3.4 x 2.3 cm. Normal. Left ovary: Not visualized. Within the left adnexa, there is a simple appearing cyst measuring 20.7 x 21.7 x 20.8 cm. Other Findings: No free fluid IMPRESSION: Large left adnexal simple appearing cystic structure. Due to the inherent limited field of view of ultrasound, not all components of this cystic structure may be accurately detected with ultrasound. Pelvic MRI with contrast is recommended in anticipation of surgical consultation. This confers a risk of ovarian torsion given its large size. Since these may be difficult to assess completely with [place],  further evaluation of simple-appearing cysts >7 cm with MRI or surgical evaluation is recommended according to the Society of Radiologists in Ultrasound 2010 Consensus Conference Statement (D Lenis Noon et al. Management of Asymptomatic Ovarian and other Adnexal Cysts Imaged at [place]: Society of Radiologists in Ultrasound Consensus Conference Statement 2010. Radiology 256 (Sept 2010): 943-954.). Endometrial thickness 1.8 cm is borderline thickened. Consider reassessment with pelvic ultrasound in 6-8 weeks during the week immediately following the patient's menses. Original Report Authenticated By: Christiana Pellant, M.D.   Assessment: Patient Active Problem List  Diagnosis  . Ovarian cyst   Plan: Recommended surgical management with operative laparoscopy, deflation of large left ovarian cyst, left salpingo-ophorectomy. The risks of surgery were discussed in detail with the patient including but not limited to: bleeding which may require transfusion or reoperation; infection which may require prolonged hospitalization or re-hospitalization and antibiotic therapy; injury to bowel, bladder, ureters and major vessels or other surrounding organs; need for additional procedures including laparotomy; thromboembolic  phenomenon, incisional problems and other postoperative or anesthesia complications. Patient was told that the likelihood that her condition and symptoms will be treated effectively with this surgical management was very high.  Likelihood of success in alleviating the patient's condition was discussed. Routine postoperative instructions will be reviewed with the patient and her family in detail after surgery.  The patient concurred with the proposed plan, giving informed written consent for the surgery.  Patient has been NPO since last night she will remain NPO for procedure.  Anesthesia and OR aware.  Preoperative prophylactic antibiotics and SCDs ordered on call to the OR.  To OR when ready.  Jaynie Collins,  M.D. 11/02/2012 12:38 PM

## 2012-11-02 NOTE — Anesthesia Preprocedure Evaluation (Signed)
Anesthesia Evaluation  Patient identified by MRN, date of birth, ID band Patient awake    Reviewed: Allergy & Precautions, H&P , NPO status , Patient's Chart, lab work & pertinent test results, reviewed documented beta blocker date and time   History of Anesthesia Complications Negative for: history of anesthetic complications  Airway Mallampati: I TM Distance: >3 FB Neck ROM: full    Dental  (+) Teeth Intact   Pulmonary neg pulmonary ROS,  breath sounds clear to auscultation  Pulmonary exam normal       Cardiovascular Exercise Tolerance: Good negative cardio ROS  Rhythm:regular Rate:Normal     Neuro/Psych negative neurological ROS  negative psych ROS   GI/Hepatic negative GI ROS, Neg liver ROS,   Endo/Other  Morbid obesity  Renal/GU negative Renal ROS  Female GU complaint     Musculoskeletal   Abdominal   Peds  Hematology negative hematology ROS (+)   Anesthesia Other Findings   Reproductive/Obstetrics negative OB ROS                           Anesthesia Physical Anesthesia Plan  ASA: III  Anesthesia Plan: General ETT   Post-op Pain Management:    Induction:   Airway Management Planned:   Additional Equipment:   Intra-op Plan:   Post-operative Plan:   Informed Consent: I have reviewed the patients History and Physical, chart, labs and discussed the procedure including the risks, benefits and alternatives for the proposed anesthesia with the patient or authorized representative who has indicated his/her understanding and acceptance.   Dental Advisory Given  Plan Discussed with: CRNA and Surgeon  Anesthesia Plan Comments:         Anesthesia Quick Evaluation  

## 2012-11-02 NOTE — Anesthesia Postprocedure Evaluation (Signed)
  Anesthesia Post-op Note  Anesthesia Post Note  Patient: Catherine Marks  Procedure(s) Performed: Procedure(s) (LRB): LAPAROSCOPY OPERATIVE WITH LYSIS OF ADHESIONS; (N/A) SALPINGO OOPHORECTOMY (Left)  Anesthesia type: General  Patient location: PACU  Post pain: Pain level controlled  Post assessment: Post-op Vital signs reviewed  Last Vitals:  Filed Vitals:   11/02/12 1845  BP: 99/69  Pulse: 85  Temp:   Resp: 18    Post vital signs: Reviewed  Level of consciousness: sedated  Complications: No apparent anesthesia complications

## 2012-11-02 NOTE — Op Note (Signed)
Catherine Marks PROCEDURE DATE: 11/02/2012  PREOPERATIVE DIAGNOSES: 23 cm unilocular left ovarian simple cyst; BMI of 51.5 POSTOPERATIVE DIAGNOSES: The same PROCEDURE: Operative laparoscopy, left salpingoophorectomy, enterolysis, lysis of adhesions SURGEON:  Dr. Jaynie Collins ASSISTANT: Dr. Willodean Rosenthal ANESTHESIOLOGIST: Dr. Dana Allan  INDICATIONS: 27 y.o. G0 with 23 cm unilocular left ovarian cyst seen on ultrasound and MRI, desiring surgical management. Risks of surgery were discussed with the patient including but not limited to: bleeding which may require transfusion or reoperation; infection which may require antibiotics; injury to bowel, bladder, ureters or other surrounding organs; need for additional procedures including laparotomy; thromboembolic phenomenon, incisional problems and other postoperative/anesthesia complications. Written informed consent was obtained.    FINDINGS: 23 cm large left adnexal cyst filling the entire pelvis and lower abdomen; had to be aspirated to obtain good visualization.  Over 2200 ml of brown fluid aspirated, sample sent for cytology. The cyst was adherent to the anterior abdominal wall, and also very adherent to the rectosigmoid colon and small bowel in the left lower quadrant.  Extensive enterolysis was done to free the bowel and colon from the adherent cyst.  The cyst was removed in its entirety.  There was a normal uterus, normal right ovary and right fallopian tube.  No other anomalies noted in the pelvis or abdomen  ANESTHESIA:   General INTRAVENOUS FLUIDS:1000  ml ESTIMATED BLOOD LOSS:10 ml URINE OUTPUT: 250 ml SPECIMENS: Cyst aspirate and cyst COMPLICATIONS: None immediate  PROCEDURE IN DETAIL:  The patient received intravenous antibiotics and had sequential compression devices applied to her lower extremities while in the preoperative area.  She was then taken to the operating room where general anesthesia was administered and was found  to be adequate.  She was placed in the dorsal lithotomy position, and was prepped and draped in a sterile manner.  A RUMI uterine manipulator was inserted.  A Foley catheter was inserted into her bladder and attached to constant drainage.  After an adequate timeout was performed, attention was turned to her abdomen where an umbilical incision was made and was advanced downwards towards the fascia. The Optiview 11-mm trocar and sleeve were then advanced with the laparoscope into the incision under direct visualization through the fascia into the abdomen.  The abdomen was then insufflated with carbon dioxide gas and adequate pneumoperitoneum was obtained.  A survey of the patient's pelvis and abdomen revealed the findings above. The cyst was occupying the entire pelvis and lower abdomen.   Under direct visualization, a 6-inch 18-gauge spinal needle was inserted through the abdomen transcutaneously into the cyst, and a 60 ml syringe was attached to the needle.  This was used to aspirate the fluid from the cyst; over 2200 ml of brown aspirate was obtained.  The cyst was deflated enough at this point, and bilateral 5-mm lower quadrant ports were then placed under direct visualization.   On the left side, the uteroovarian ligament and the fallopian tube were transected with the Harmonic device.   There were dense adhesions of the anterior surface of the cyst to the abdominal wall, these were also lysed using the Harmonic device.     There were dense adhesions of the cyst to the rectosigmoid colon and the bowel in the left lower quadrant. Using blunt graspers, the bowel and colon were painstakingly separated from the cyst.  Once the cyst was freed after the extensive enterolysis, the left infundibulopelvic ligament was transected allowing for salpingooophorectomy.  Excellent hemostasis was noted. The specimen was  placed in a 15 cm Endocatch bag and removed through the right lower quadrant port that was enlarged to  accommodate the 15 mm trocar and cyst.   The operative site was surveyed, and it was found to be hemostatic.   No intraoperative injury to other surrounding organs was noted.  The fascial incision of the umbilicus and right lower quadrant incision was closed with 0 Vicryl figure of eight stitches under direct visualization.  The abdomen was desufflated and all instruments were then removed from the patient's abdomen.  All skin incisions were closed with 3-0 Vicryl subcuticular stitches and Dermabond.   Given the amount of enterolysis done, the patient will be observed overnight to ensure that there are no bowel issues prior to discharge.

## 2012-11-02 NOTE — Preoperative (Signed)
Beta Blockers   Reason not to administer Beta Blockers:Not Applicable 

## 2012-11-02 NOTE — Transfer of Care (Signed)
Immediate Anesthesia Transfer of Care Note  Patient: Catherine Marks  Procedure(s) Performed: Procedure(s) with comments: LAPAROSCOPY OPERATIVE WITH LYSIS OF ADHESIONS; (N/A) SALPINGO OOPHORECTOMY (Left) - left  with enterolysis  Patient Location: PACU  Anesthesia Type:General  Level of Consciousness: awake, alert , oriented and patient cooperative  Airway & Oxygen Therapy: Patient Spontanous Breathing and Patient connected to nasal cannula oxygen  Post-op Assessment: Report given to PACU RN, Post -op Vital signs reviewed and stable and Patient moving all extremities X 4  Post vital signs: Reviewed and stable  Complications: No apparent anesthesia complications

## 2012-11-02 NOTE — OR Nursing (Signed)
Surgiflo  Used @ 1500 In abdomen  Exp 10/2013  Lot#  [id number]  Placed by Macon Large

## 2012-11-03 ENCOUNTER — Encounter (HOSPITAL_COMMUNITY): Payer: Self-pay | Admitting: Obstetrics & Gynecology

## 2012-11-03 LAB — CBC
HCT: 30 % — ABNORMAL LOW (ref 36.0–46.0)
Hemoglobin: 9.7 g/dL — ABNORMAL LOW (ref 12.0–15.0)
MCH: 27.7 pg (ref 26.0–34.0)
MCHC: 32.3 g/dL (ref 30.0–36.0)
MCV: 85.7 fL (ref 78.0–100.0)
Platelets: 326 10*3/uL (ref 150–400)
RBC: 3.5 MIL/uL — ABNORMAL LOW (ref 3.87–5.11)
RDW: 13.7 % (ref 11.5–15.5)
WBC: 14.8 10*3/uL — ABNORMAL HIGH (ref 4.0–10.5)

## 2012-11-03 LAB — COMPREHENSIVE METABOLIC PANEL
ALT: 41 U/L — ABNORMAL HIGH (ref 0–35)
AST: 37 U/L (ref 0–37)
Albumin: 2.7 g/dL — ABNORMAL LOW (ref 3.5–5.2)
Alkaline Phosphatase: 91 U/L (ref 39–117)
BUN: 6 mg/dL (ref 6–23)
CO2: 25 mEq/L (ref 19–32)
Calcium: 9.4 mg/dL (ref 8.4–10.5)
Chloride: 105 mEq/L (ref 96–112)
Creatinine, Ser: 0.56 mg/dL (ref 0.50–1.10)
GFR calc Af Amer: >90 mL/min (ref >90)
GFR calc non Af Amer: >90 mL/min (ref >90)
Glucose, Bld: 135 mg/dL — ABNORMAL HIGH (ref 70–99)
Potassium: 4.1 mEq/L (ref 3.5–5.1)
Sodium: 139 mEq/L (ref 135–145)
Total Bilirubin: 0.2 mg/dL — ABNORMAL LOW (ref 0.3–1.2)
Total Protein: 6.1 g/dL (ref 6.0–8.3)

## 2012-11-03 MED ORDER — OXYCODONE-ACETAMINOPHEN 5-325 MG PO TABS: 1.0000 | ORAL_TABLET | Freq: Four times a day (QID) | ORAL | Status: DC | PRN

## 2012-11-03 MED ORDER — DSS 100 MG PO CAPS: 100.0000 mg | ORAL_CAPSULE | Freq: Two times a day (BID) | ORAL | Status: DC

## 2012-11-03 MED ORDER — IBUPROFEN 600 MG PO TABS: 600.0000 mg | ORAL_TABLET | Freq: Four times a day (QID) | ORAL | Status: DC | PRN

## 2012-11-03 MED FILL — Heparin Sodium (Porcine) Inj 5000 Unit/ML: INTRAMUSCULAR | Qty: 1 | Status: AC

## 2012-11-03 NOTE — Progress Notes (Signed)
CSW received consult for "financial difficulties obtaining medications."  This consult should have been sent to Care Management, not CSW.  CSW left message for Care Manager/T. Craft to follow up.

## 2012-11-03 NOTE — Discharge Summary (Signed)
Gynecology Physician Discharge Summary  Patient ID: [name] MRN: [id number] DOB/AGE: 13-Dec-1985 26 y.o.  Admit date: 11/02/2012 Discharge date: 11/03/2012  Preoperative Diagnoses: 23 cm unilocular left ovarian simple cyst; BMI of 51.5  Procedures: LAPAROSCOPY OPERATIVE WITH LYSIS OF ADHESIONS LEFT SALPINGO OOPHORECTOMY  ENTEROLYSIS  Significant Diagnostic Studies:  Lab 10/28/12 1130 11/03/12 0540  WBC 12.6* 14.8*  HGB 12.9 9.7*  HCT 40.8 30.0*  PLT 378 326     Hospital Course:  [name] is a 27 y.o. G0 admitted for scheduled laparoscopic  surgery.  She underwent the procedures as mentioned above, her operation involved extensive enterolysis, so she was observed overnight. For further details about surgery, please refer to the operative report. Patient had an uncomplicated postoperative course. By time of discharge, her pain was controlled on oral pain medications; she was ambulating, voiding without difficulty, tolerating regular diet and passing flatus. She was deemed stable for discharge to home.   Discharge Exam: Blood pressure 123/58, pulse 99, temperature 98 F (36.7 C), temperature source Oral, resp. rate 17, height 5\' 3"  (1.6 m), weight 290 lb (131.543 kg), last menstrual period 10/29/2012, SpO2 95.00%. General appearance: alert and no distress Resp: clear to auscultation bilaterally Cardio: regular rate and rhythm GI: soft, non-tender; bowel sounds normal; no masses,  no organomegaly.  Incisions C/D/I Pelvic: deferred Extremities: extremities normal, atraumatic, no cyanosis or edema and Homans sign is negative, no sign of DVT  Discharged Condition: Stable  Disposition: Home    Medication List    STOP taking these medications       ADVIL PM 200-25 MG Caps  Generic drug:  Ibuprofen-Diphenhydramine HCl      TAKE these medications       cyclobenzaprine 10 MG tablet  Commonly known as:  FLEXERIL  Take 10 mg by mouth 3 (three) times daily as needed for  muscle spasms.     DSS 100 MG Caps  Take 100 mg by mouth 2 (two) times daily.     ibuprofen 600 MG tablet  Commonly known as:  ADVIL,MOTRIN  Take 1 tablet (600 mg total) by mouth every 6 (six) hours as needed (mild pain).     oxyCODONE-acetaminophen 5-325 MG per tablet  Commonly known as:  PERCOCET/ROXICET  Take 1 tablet by mouth every 6 (six) hours as needed (moderate to severe pain).       Follow-up Information   Follow up with [facility] A, MD. Schedule an appointment as soon as possible for a visit in 3 weeks. (Call clinic or come to MAU for any concerning symptoms)    Contact information:   [facility] [address] [place] [address] [phone]       Signed: Jaynie Collins, MD, FACOG Attending Obstetrician & Gynecologist Faculty Practice, [facility]

## 2012-11-03 NOTE — Progress Notes (Signed)
Pt ambulated out  Teaching  Complete  

## 2012-11-05 ENCOUNTER — Telehealth: Payer: Self-pay | Admitting: *Deleted

## 2012-11-05 ENCOUNTER — Encounter: Payer: Self-pay | Admitting: Obstetrics & Gynecology

## 2012-11-05 NOTE — Telephone Encounter (Signed)
Message copied by Gerome Apley on Thu Nov 05, 2012 10:21 AM ------      Message from: Jaynie Collins A      Created: Thu Nov 05, 2012  9:57 AM       Benign pathology, called patient but got nonspecific voicemail recording.  Please call to inform patient of results. Also please schedule postop appointment with me in 2-3 weeks, and let patient know of appointment date and time.  Thank you!       ------

## 2012-11-05 NOTE — Telephone Encounter (Signed)
Called Moranda and left a message we are calling with some non- urgent information - please call clinic and leave message of best number/time to reach you and if we may leave detailed information on your voicemail.

## 2012-11-09 ENCOUNTER — Telehealth: Payer: Self-pay

## 2012-11-09 NOTE — Telephone Encounter (Signed)
Informed patient of results.  She had no questions.  I advised her to make an appointment for a post op follow up with Dr. Macon Large for 2-3 weeks from now.  She voiced understanding and was transferred up front.

## 2012-11-09 NOTE — Telephone Encounter (Signed)
Opened in error

## 2012-11-23 ENCOUNTER — Ambulatory Visit (INDEPENDENT_AMBULATORY_CARE_PROVIDER_SITE_OTHER): Payer: Self-pay | Admitting: Obstetrics & Gynecology

## 2012-11-23 ENCOUNTER — Encounter: Payer: Self-pay | Admitting: Obstetrics & Gynecology

## 2012-11-23 VITALS — BP 119/76 | HR 84 | Temp 96.7°F | Ht 63.0 in | Wt 280.3 lb

## 2012-11-23 DIAGNOSIS — Z09 Encounter for follow-up examination after completed treatment for conditions other than malignant neoplasm: Secondary | ICD-10-CM

## 2012-11-23 LAB — POCT URINALYSIS DIP (DEVICE)
Bilirubin Urine: NEGATIVE
Glucose, UA: NEGATIVE mg/dL
Hgb urine dipstick: NEGATIVE
Ketones, ur: NEGATIVE mg/dL
Nitrite: NEGATIVE
Protein, ur: NEGATIVE mg/dL
Specific Gravity, Urine: >=1.03 (ref 1.005–1.030)
Urobilinogen, UA: 0.2 mg/dL (ref 0.0–1.0)
pH: 5.5 (ref 5.0–8.0)

## 2012-11-23 NOTE — Patient Instructions (Addendum)
Return to clinic for any scheduled appointments or for any gynecologic concerns as needed.   

## 2012-11-23 NOTE — Progress Notes (Signed)
  Subjective:     Catherine Marks is a 27 y.o. G0 female who presents to the clinic s/p operative laparoscopy, left salpingoophorectomy, enterolysis, lysis of adhesions for 23 cm unilocular ovarian cyst on 11/02/12. Eating a regular diet without difficulty. Bowel movements are normal. The patient is not having any pain.  The following portions of the patient's history were reviewed and updated as appropriate: allergies, current medications, past family history, past medical history, past social history, past surgical history and problem list.  Review of Systems Pertinent items are noted in HPI.    Objective:    BP 119/76  Pulse 84  Temp(Src) 96.7 F (35.9 C) (Oral)  Ht 5\' 3"  (1.6 m)  Wt 280 lb 4.8 oz (127.143 kg)  BMI 49.67 kg/m2  LMP 10/26/2012 General:  alert and no distress  Abdomen: soft, bowel sounds active, non-tender, obese  Incision:   healing well, no drainage, no erythema, no hernia, no seroma, no swelling, well approximated, no dehiscence     11/02/12 Pathology of ovary and fallopian tube, left - BENIGN SEROUS CYST WITH FIBROSIS, HEMORRHAGE AND INFLAMMATION.  Assessment:    Doing well postoperatively. Operative findings again reviewed. Pathology report discussed.    Plan:    1. Continue any current medications. 2. Activity restrictions: none 3. Anticipated return to work: not applicable. 4. Follow up: as needed 5.  Routine preventative health maintenance measures emphasized

## 2012-12-07 ENCOUNTER — Encounter: Payer: Self-pay | Admitting: *Deleted

## 2014-10-25 IMAGING — US US PELVIS COMPLETE
1 series · 13 of 25 positions shown · non-contrast
Comparison: The outside prior study and report are not available
for comparison.

CLINICAL DATA: Ovarian cyst on prior outside ultrasound



[Series 1: us pelvis complete · 13 of 49 slices shown]
[im 1/49]
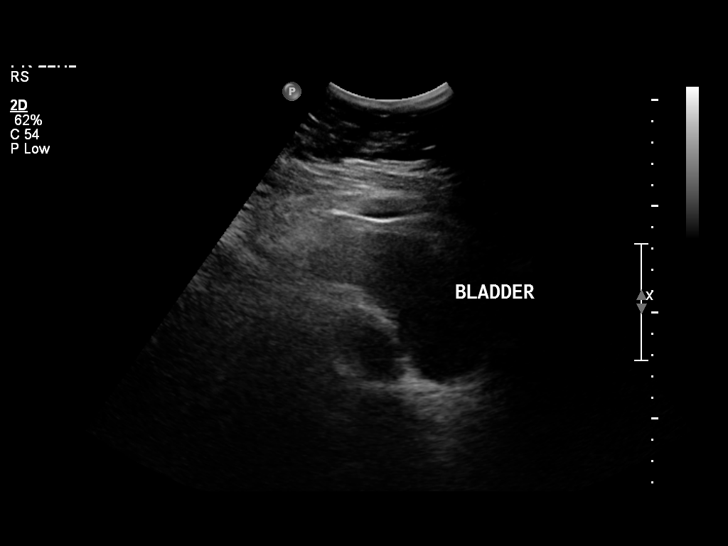
[im 5/49]
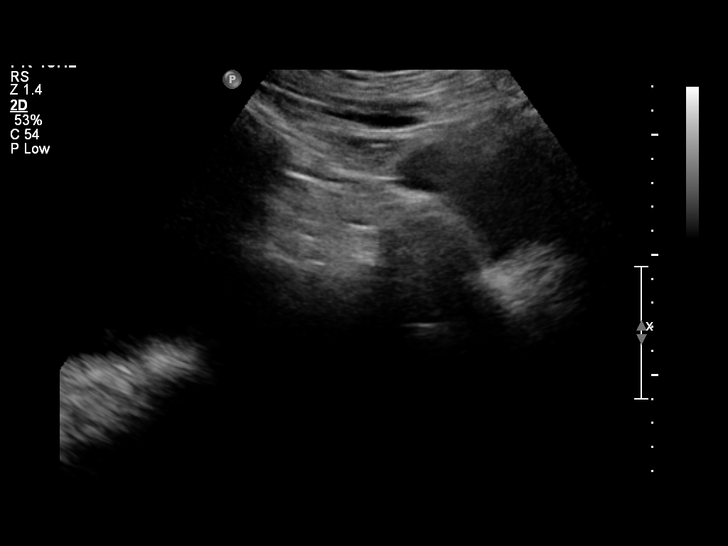
[im 9/49]
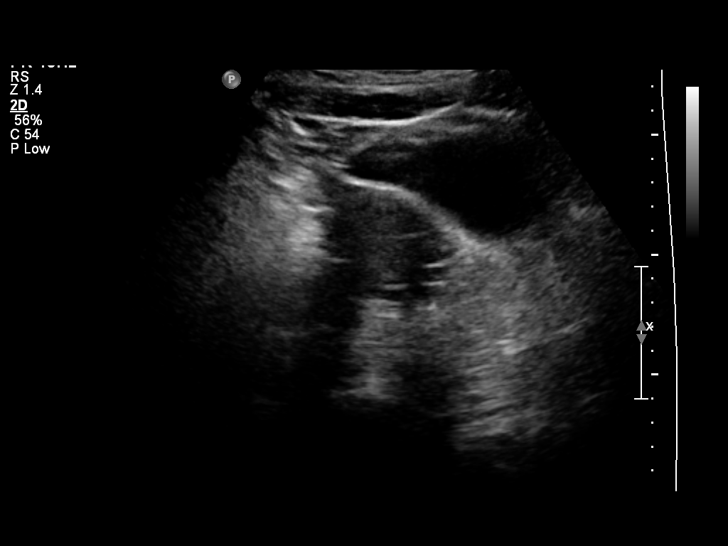
[im 13/49]
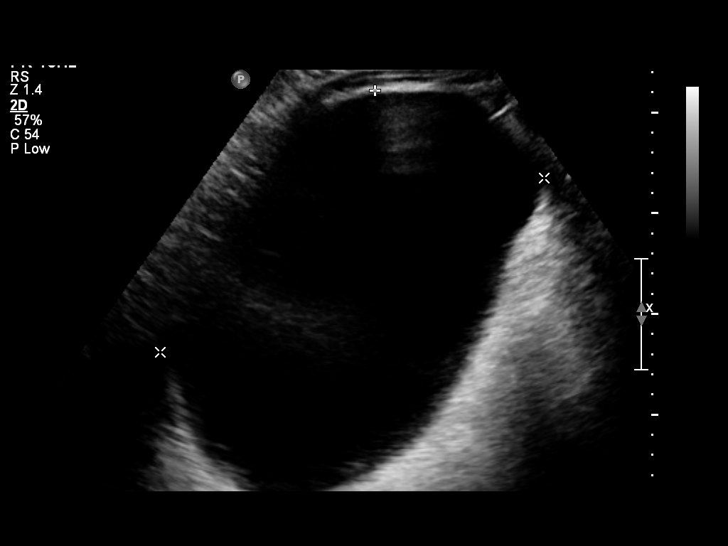
[im 17/49]
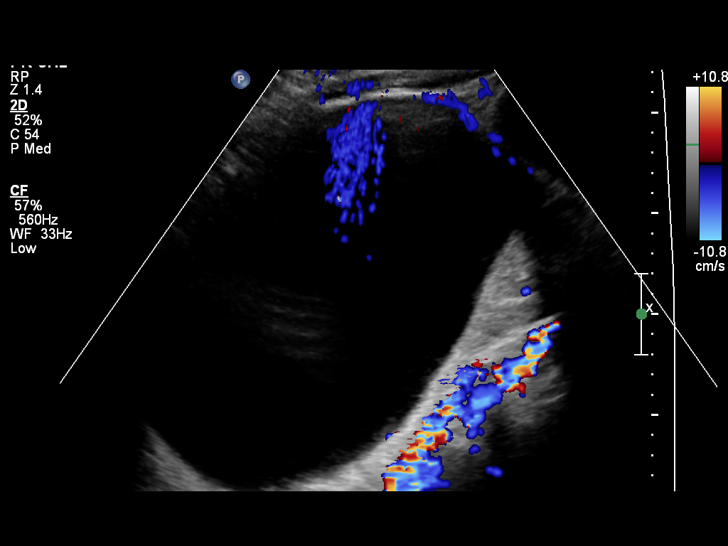
[im 21/49]
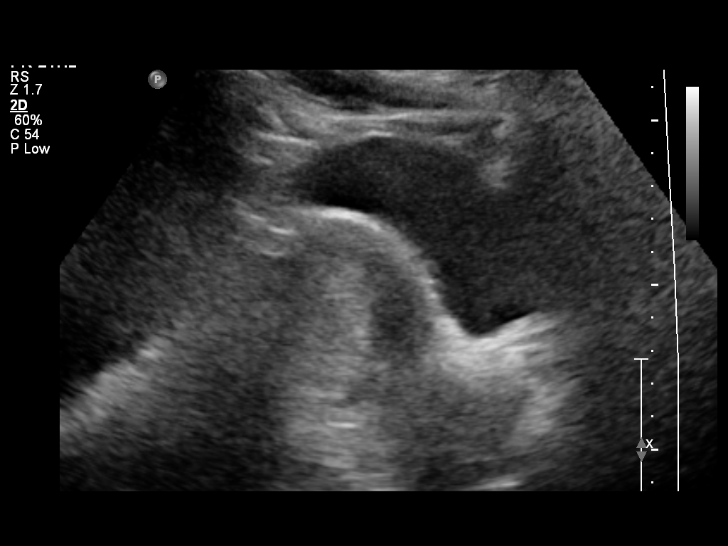
[im 25/49]
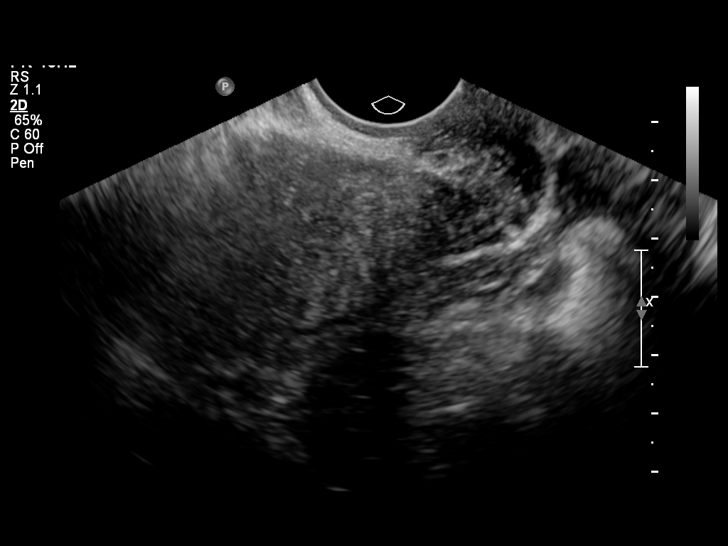
[im 29/49]
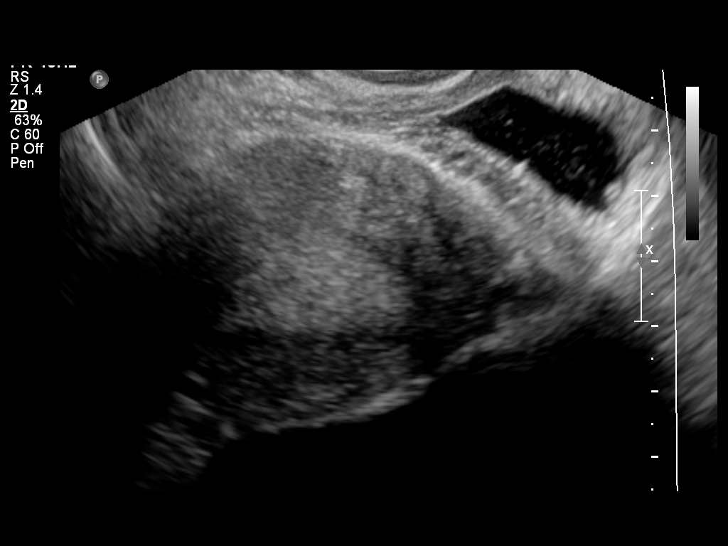
[im 33/49]
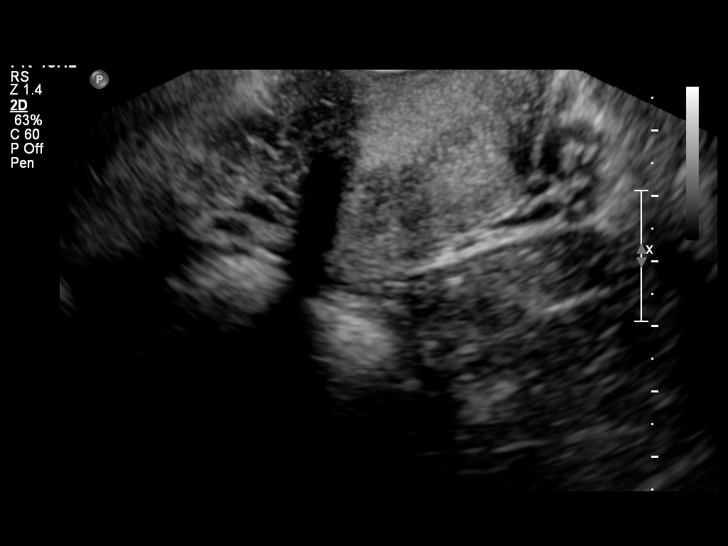
[im 37/49]
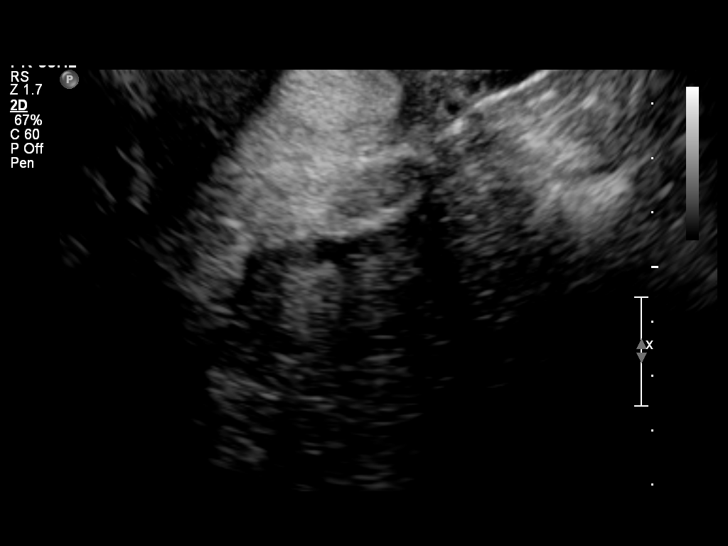
[im 41/49]
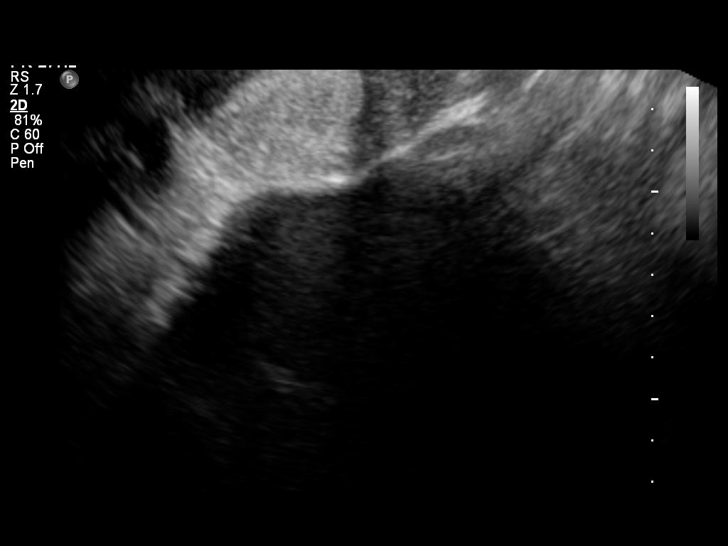
[im 45/49]
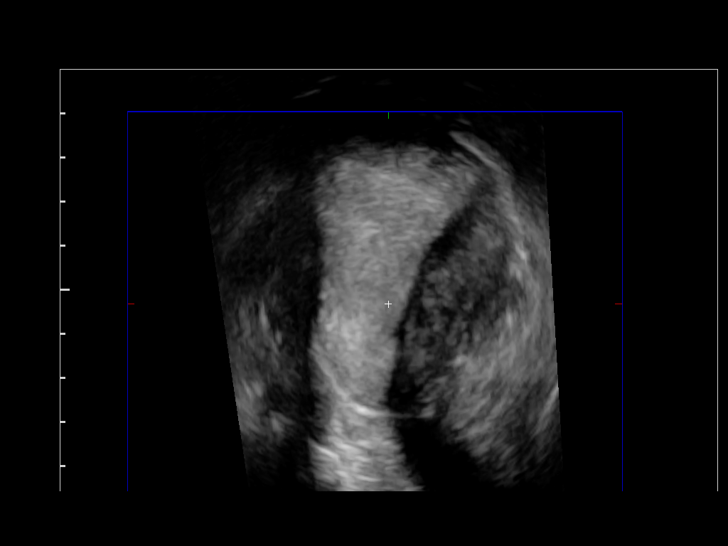
[im 49/49]
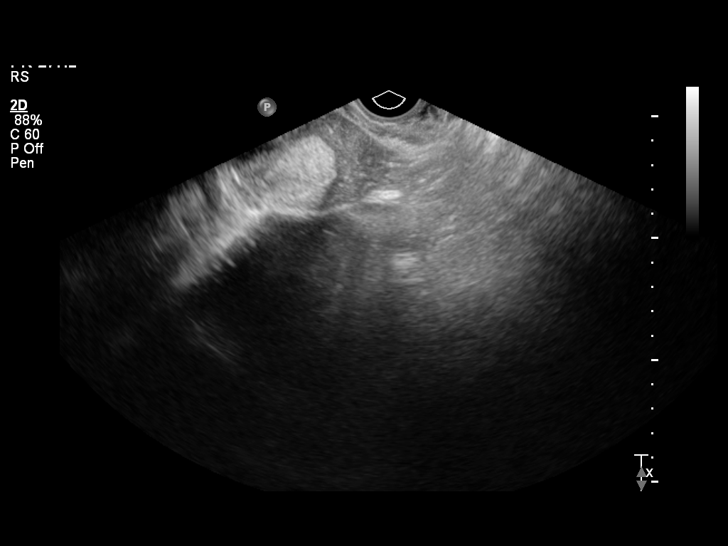

[13 of 25 positions shown; findings below may reference images not displayed]

FINDINGS: Uterus:  9.4 x 5.7 x 5.4 cm.  Anteverted, anteflexed.  No focal
abnormality.

Endometrium: 1.8 cm.  Diffusely echogenic without identifiable
focal abnormality.

Right ovary: 2.8 x 3.4 x 2.3 cm.  Normal.

Left ovary: Not visualized.  Within the left adnexa, there is a
simple appearing cyst measuring 20.7 x 21.7 x 20.8 cm.

Other Findings:  No free fluid
IMPRESSION: Large left adnexal simple appearing cystic structure.  Due to the
inherent limited field of view of ultrasound, not all components of
this cystic structure may be accurately detected with ultrasound.
Pelvic MRI with contrast is recommended in anticipation of surgical
consultation.  This confers a risk of ovarian torsion given its
large size. Since these may be difficult to assess completely with
US, further evaluation of simple-appearing cysts >7 cm with MRI or
surgical evaluation is recommended according to the Society of
Radiologists in Ultrasound 7070 Consensus Conference Statement (AUJLA
Tiger et al.  Management of Asymptomatic Ovarian and other Adnexal
Cysts Imaged at [place]:  Society of Radiologists in Ultrasound
Consensus Conference Statement 7070.  Radiology [DATE]):

Endometrial thickness 1.8 cm is borderline thickened.  Consider
reassessment with pelvic ultrasound in 6-8 weeks during the week
immediately following the patient's menses.

## 2016-02-02 DIAGNOSIS — M6240 Contracture of muscle, unspecified site: Secondary | ICD-10-CM | POA: Insufficient documentation

## 2016-02-02 DIAGNOSIS — M722 Plantar fascial fibromatosis: Secondary | ICD-10-CM | POA: Insufficient documentation

## 2018-03-18 DIAGNOSIS — F330 Major depressive disorder, recurrent, mild: Secondary | ICD-10-CM | POA: Diagnosis not present

## 2018-03-18 DIAGNOSIS — J309 Allergic rhinitis, unspecified: Secondary | ICD-10-CM | POA: Diagnosis not present

## 2018-03-18 DIAGNOSIS — Z6841 Body Mass Index (BMI) 40.0 and over, adult: Secondary | ICD-10-CM | POA: Diagnosis not present

## 2018-04-20 DIAGNOSIS — E8881 Metabolic syndrome: Secondary | ICD-10-CM | POA: Diagnosis not present

## 2018-04-20 DIAGNOSIS — Z0000 Encounter for general adult medical examination without abnormal findings: Secondary | ICD-10-CM | POA: Diagnosis not present

## 2018-04-20 DIAGNOSIS — R7302 Impaired glucose tolerance (oral): Secondary | ICD-10-CM | POA: Diagnosis not present

## 2018-04-20 DIAGNOSIS — Z6841 Body Mass Index (BMI) 40.0 and over, adult: Secondary | ICD-10-CM | POA: Diagnosis not present

## 2018-04-20 DIAGNOSIS — R233 Spontaneous ecchymoses: Secondary | ICD-10-CM | POA: Diagnosis not present

## 2018-05-22 DIAGNOSIS — R7302 Impaired glucose tolerance (oral): Secondary | ICD-10-CM | POA: Diagnosis not present

## 2018-05-22 DIAGNOSIS — J309 Allergic rhinitis, unspecified: Secondary | ICD-10-CM | POA: Diagnosis not present

## 2018-05-22 DIAGNOSIS — F330 Major depressive disorder, recurrent, mild: Secondary | ICD-10-CM | POA: Diagnosis not present

## 2018-05-22 DIAGNOSIS — R030 Elevated blood-pressure reading, without diagnosis of hypertension: Secondary | ICD-10-CM | POA: Diagnosis not present

## 2018-05-22 DIAGNOSIS — Z0000 Encounter for general adult medical examination without abnormal findings: Secondary | ICD-10-CM | POA: Diagnosis not present

## 2018-05-22 DIAGNOSIS — E8881 Metabolic syndrome: Secondary | ICD-10-CM | POA: Diagnosis not present

## 2018-08-25 ENCOUNTER — Encounter: Payer: Self-pay | Admitting: Family Medicine

## 2018-08-25 ENCOUNTER — Ambulatory Visit: Payer: BLUE CROSS/BLUE SHIELD | Admitting: Family Medicine

## 2018-08-25 DIAGNOSIS — Z713 Dietary counseling and surveillance: Secondary | ICD-10-CM | POA: Diagnosis not present

## 2018-08-25 MED ORDER — PHENTERMINE HCL 37.5 MG PO TABS: 37.5000 mg | ORAL_TABLET | Freq: Every day | ORAL | 0 refills | Status: DC

## 2018-08-25 NOTE — Progress Notes (Signed)
Subjective:    Patient ID: Catherine Marks, female    DOB: 09-Feb-1986, 33 y.o.   MRN: [id number]  HPI   Patient presents to clinic to establish with PCP.  Her main concern today is weight loss.  Patient has taken phentermine in the past with success in losing some weight.  Patient would like to go on a course of phentermine again to help jumpstart her weight loss.  Patient's goal for total weight loss is to get down to 180 pounds.  Denies any other complaints today.  Medical history, surgical history, social history, family history reviewed and updated accordingly in chart.  Patient believes she is due for Pap smear and blood work, we will plan to do that at next visit for her complete physical exam.  Patient last had lab work done at previous PCP in October 2019, per patient all results were normal.  I am not able to see the lab results in care everywhere, but we will request these results.   Patient Active Problem List   Diagnosis Date Noted  . Morbid obesity ([place]) 08/25/2018  . Weight loss counseling, encounter for 08/25/2018   Past Surgical History:  Procedure Laterality Date  . LAPAROSCOPY N/A 11/02/2012   Procedure: LAPAROSCOPY OPERATIVE WITH LYSIS OF ADHESIONS;;  Surgeon: Osborne Oman, MD;  Location: [facility];  Service: Gynecology;  Laterality: N/A;  . NO PAST SURGERIES    . SALPINGOOPHORECTOMY Left 11/02/2012   Procedure: SALPINGO OOPHORECTOMY;  Surgeon: Osborne Oman, MD;  Location: [facility];  Service: Gynecology;  Laterality: Left;  left  with enterolysis   Social History   Tobacco Use  . Smoking status: Never Smoker  . Smokeless tobacco: Never Used  Substance Use Topics  . Alcohol use: Yes    Comment: socially   Family History  Problem Relation Age of Onset  . Heart disease Father   . Diabetes Father   . Early death Father   . Heart attack Father   . Hypertension Father   . Drug abuse Mother   . Early death Maternal Grandmother   . Mental illness Maternal  Grandmother   . Drug abuse Maternal Grandfather   . Alcohol abuse Maternal Grandfather   . Arthritis Paternal Grandmother   . ADD / ADHD Paternal Grandfather   . Early death Paternal Grandfather    Review of Systems  Constitutional: Negative for chills, fatigue and fever.  HENT: Negative for congestion, ear pain, sinus pain and sore throat.   Eyes: Negative.   Respiratory: Negative for cough, shortness of breath and wheezing.   Cardiovascular: Negative for chest pain, palpitations and leg swelling.  Gastrointestinal: Negative for abdominal pain, diarrhea, nausea and vomiting.  Genitourinary: Negative for dysuria, frequency and urgency.  Musculoskeletal: Negative for arthralgias and myalgias.  Skin: Negative for color change, pallor and rash.  Neurological: Negative for syncope, light-headedness and headaches.  Psychiatric/Behavioral: The patient is not nervous/anxious.       Objective:   Physical Exam  Constitutional:  She appears well-developed and well-nourished. No distress.  HENT:  Head: Normocephalic and atraumatic.  Eyes: Pupils are equal, round, and reactive to light. EOM are normal. No scleral icterus.  Neck: Normal range of motion. Neck supple. No tracheal deviation present.  Cardiovascular: Normal rate, regular rhythm and normal heart sounds.  Pulmonary/Chest: Effort normal and breath sounds normal. No respiratory distress. She has no wheezes. She has no rales.  Abdominal: Soft. Bowel sounds are normal. There is no tenderness.  Neurological:  She is alert and oriented to person, place, and time.  Gait normal  Skin: Skin is warm and dry. No pallor.  Psychiatric: She has a normal mood and affect. Her behavior is normal.    Nursing note and vitals reviewed.   Vitals:   08/25/18 1500  BP: 122/78  Pulse: 80  Resp: 16  Temp: 98.6 F (37 C)  SpO2: 98%   Body mass index is 45.5 kg/m.  Wt Readings from Last 3 Encounters:  08/25/18 252 lb 12.8 oz (114.7 kg)    11/23/12 280 lb 4.8 oz (127.1 kg)  11/02/12 290 lb (131.5 kg)      Assessment & Plan:    A total of 30  minutes were spent face-to-face with the patient during this encounter and over half of that time was spent on counseling and coordination of care. The patient was counseled on weight loss, use of phentermine, diet/exercise.   Morbid obesity, weight loss counseling - patient will begin phentermine once per day to help jump start weight loss.  Advised patient that she will only take this medication for the next 3 to 4 months, then we will have to have her stop.  Phentermine is a great medicine to help suppress appetite, but it is not meant for long-term use.  Discussed about healthy diet and regular exercise -patient is working on eating a high-protein high vegetable, low carb low sugar diet and going to the gym at least 3-4 nights per week.  Patient will follow-up here in approximately 1 month for recheck on weight loss as well as to do complete physical exam with Pap smear and blood work.

## 2018-08-25 NOTE — Progress Notes (Signed)
6.4

## 2018-09-22 ENCOUNTER — Telehealth: Payer: Self-pay | Admitting: Lab

## 2018-09-22 ENCOUNTER — Telehealth: Payer: Self-pay | Admitting: Family Medicine

## 2018-09-22 ENCOUNTER — Ambulatory Visit (INDEPENDENT_AMBULATORY_CARE_PROVIDER_SITE_OTHER): Payer: BLUE CROSS/BLUE SHIELD | Admitting: Family Medicine

## 2018-09-22 ENCOUNTER — Encounter: Payer: Self-pay | Admitting: Family Medicine

## 2018-09-22 ENCOUNTER — Other Ambulatory Visit (HOSPITAL_COMMUNITY)
Admission: RE | Admit: 2018-09-22 | Discharge: 2018-09-22 | Disposition: A | Discharge status: Home / Self Care | Payer: BLUE CROSS/BLUE SHIELD | Source: Ambulatory Visit | Attending: Family Medicine | Admitting: Family Medicine

## 2018-09-22 VITALS — BP 122/76 | HR 88 | Temp 98.4°F | Resp 18 | Ht 64.0 in | Wt 250.4 lb

## 2018-09-22 DIAGNOSIS — Z0000 Encounter for general adult medical examination without abnormal findings: Secondary | ICD-10-CM

## 2018-09-22 DIAGNOSIS — Z124 Encounter for screening for malignant neoplasm of cervix: Secondary | ICD-10-CM

## 2018-09-22 DIAGNOSIS — E538 Deficiency of other specified B group vitamins: Secondary | ICD-10-CM

## 2018-09-22 DIAGNOSIS — M722 Plantar fascial fibromatosis: Secondary | ICD-10-CM | POA: Diagnosis not present

## 2018-09-22 DIAGNOSIS — E559 Vitamin D deficiency, unspecified: Secondary | ICD-10-CM

## 2018-09-22 DIAGNOSIS — Z713 Dietary counseling and surveillance: Secondary | ICD-10-CM | POA: Diagnosis not present

## 2018-09-22 LAB — CBC
HCT: 40.8 % (ref 36.0–46.0)
Hemoglobin: 13.2 g/dL (ref 12.0–15.0)
MCHC: 32.3 g/dL (ref 30.0–36.0)
MCV: 86 fl (ref 78.0–100.0)
Platelets: 351 10*3/uL (ref 150.0–400.0)
RBC: 4.74 Mil/uL (ref 3.87–5.11)
RDW: 13.9 % (ref 11.5–15.5)
WBC: 7.5 10*3/uL (ref 4.0–10.5)

## 2018-09-22 LAB — COMPREHENSIVE METABOLIC PANEL
ALT: 13 U/L (ref 0–35)
AST: 14 U/L (ref 0–37)
Albumin: 4 g/dL (ref 3.5–5.2)
Alkaline Phosphatase: 80 U/L (ref 39–117)
BUN: 16 mg/dL (ref 6–23)
CO2: 28 mEq/L (ref 19–32)
Calcium: 9 mg/dL (ref 8.4–10.5)
Chloride: 103 mEq/L (ref 96–112)
Creatinine, Ser: 0.59 mg/dL (ref 0.40–1.20)
GFR: 118.37 mL/min (ref >60.00)
Glucose, Bld: 92 mg/dL (ref 70–99)
Potassium: 4.1 mEq/L (ref 3.5–5.1)
Sodium: 137 mEq/L (ref 135–145)
Total Bilirubin: 0.4 mg/dL (ref 0.2–1.2)
Total Protein: 7.1 g/dL (ref 6.0–8.3)

## 2018-09-22 LAB — B12 AND FOLATE PANEL
Folate: 14 ng/mL (ref >5.9)
Vitamin B-12: 187 pg/mL — ABNORMAL LOW (ref 211–911)

## 2018-09-22 LAB — LIPID PANEL
Cholesterol: 153 mg/dL (ref 0–200)
HDL: 43.9 mg/dL (ref >39.00)
LDL Cholesterol: 88 mg/dL (ref 0–99)
NonHDL: 109.57
Total CHOL/HDL Ratio: 3
Triglycerides: 109 mg/dL (ref 0.0–149.0)
VLDL: 21.8 mg/dL (ref 0.0–40.0)

## 2018-09-22 LAB — VITAMIN D 25 HYDROXY (VIT D DEFICIENCY, FRACTURES): VITD: 11.46 ng/mL — ABNORMAL LOW (ref 30.00–100.00)

## 2018-09-22 LAB — HEMOGLOBIN A1C: Hgb A1c MFr Bld: 5.6 % (ref 4.6–6.5)

## 2018-09-22 MED ORDER — PHENTERMINE HCL 37.5 MG PO TABS: 37.5000 mg | ORAL_TABLET | Freq: Every day | ORAL | 0 refills | Status: DC

## 2018-09-22 NOTE — Progress Notes (Signed)
Subjective:    Patient ID: Catherine Marks, female    DOB: 08-04-1986, 33 y.o.   MRN: [id number]  HPI   Patient presents to clinic for CPE with Pap smear.  Overall patient is feeling well.  Has no concerns.  Has been tolerating phentermine without any side effects, denies palpitations, feeling jittery, feeling short of breath.  Patient has been working on eating a healthy diet including lean proteins lots of vegetables and lower carbs and getting regular physical activity.  Patient does report a history of plantar fasciitis, had gone to podiatry in the past to get injections of steroid and foot.  Is interested in doing this again due to heels beginning to ache/feet hurting off and on after being on feet for extended time periods.  Has not done blood work in a few years, we will get fasting blood work today.  Sees eye doctor every 1-2 years. Sees dentist 1-2 times per year.   Patient Active Problem List   Diagnosis Date Noted  . Morbid obesity ([place]) 08/25/2018  . Weight loss counseling, encounter for 08/25/2018   Social History   Tobacco Use  . Smoking status: Never Smoker  . Smokeless tobacco: Never Used  Substance Use Topics  . Alcohol use: Yes    Comment: socially   Family History  Problem Relation Age of Onset  . Heart disease Father   . Diabetes Father   . Early death Father   . Heart attack Father   . Hypertension Father   . Drug abuse Mother   . Early death Maternal Grandmother   . Mental illness Maternal Grandmother   . Drug abuse Maternal Grandfather   . Alcohol abuse Maternal Grandfather   . Arthritis Paternal Grandmother   . ADD / ADHD Paternal Grandfather   . Early death Paternal Grandfather     Review of Systems  Constitutional: Negative for chills, fatigue and fever.  HENT: Negative for congestion, ear pain, sinus pain and sore throat.   Eyes: Negative.   Respiratory: Negative for cough, shortness of breath and wheezing.   Cardiovascular: Negative for  chest pain, palpitations and leg swelling.  Gastrointestinal: Negative for abdominal pain, diarrhea, nausea and vomiting.  Genitourinary: Negative for dysuria, frequency and urgency.  Musculoskeletal: Negative for arthralgias and myalgias.  Skin: Negative for color change, pallor and rash.  Neurological: Negative for syncope, light-headedness and headaches.  Psychiatric/Behavioral: The patient is not nervous/anxious.       Objective:   Physical Exam Vitals signs and nursing note reviewed.  Constitutional:      Appearance: She is well-developed.  HENT:     Head: Normocephalic and atraumatic.     Right Ear: External ear normal.     Left Ear: External ear normal.     Nose: Nose normal.  Eyes:     General: No scleral icterus.       Right eye: No discharge.        Left eye: No discharge.     Conjunctiva/sclera: Conjunctivae normal.     Pupils: Pupils are equal, round, and reactive to light.  Neck:     Musculoskeletal: Normal range of motion and neck supple.     Trachea: No tracheal deviation.  Cardiovascular:     Rate and Rhythm: Normal rate and regular rhythm.     Heart sounds: Normal heart sounds. No murmur. No friction rub. No gallop.   Pulmonary:     Effort: Pulmonary effort is normal. No respiratory distress.  Breath sounds: Normal breath sounds. No wheezing or rales.  Chest:     Chest wall: No tenderness.  Abdominal:     General: Bowel sounds are normal. There is no distension.     Palpations: Abdomen is soft. There is no mass.     Tenderness: There is no abdominal tenderness. There is no guarding or rebound.     Hernia: No hernia is present. There is no hernia in the right inguinal area or left inguinal area.  Genitourinary:    Labia:        Right: No rash, tenderness, lesion or injury.        Left: No rash, tenderness, lesion or injury.      Vagina: Normal.     Cervix: Normal.     Adnexa:        Right: No tenderness.         Left: No tenderness.       Comments:  Pap smear collected and sent to lab Musculoskeletal: Normal range of motion.        General: No deformity.  Lymphadenopathy:     Cervical: No cervical adenopathy.     Lower Body: No right inguinal adenopathy. No left inguinal adenopathy.  Skin:    General: Skin is warm and dry.     Capillary Refill: Capillary refill takes less than 2 seconds.     Coloration: Skin is not pale.     Findings: No erythema.  Neurological:     Mental Status: She is alert and oriented to person, place, and time.     Cranial Nerves: No cranial nerve deficit.  Psychiatric:        Behavior: Behavior normal.        Thought Content: Thought content normal.    Vitals:   09/22/18 0823  BP: 122/76  Pulse: 88  Resp: 18  Temp: 98.4 F (36.9 C)  SpO2: 98%   Wt Readings from Last 3 Encounters:  09/22/18 250 lb 6.4 oz (113.6 kg)  08/25/18 252 lb 12.8 oz (114.7 kg)  11/23/12 280 lb 4.8 oz (127.1 kg)      Assessment & Plan:   Well adult exam - we will get fasting lab work today.  Pap smear collected and sent to lab.  Patient will continue to work on healthy diet and exercise including lots of lean proteins, vegetables, lower carbs lower sugars, regular physical activity at least 30 minutes/day 3 to 5 days/week.  She will increase water intake.  Patient always wears seatbelt while in vehicle.  She sees eye doctor and dentist regularly.  Discussed safe sun practices for skin protection including wearing SPF of at least 30 when outdoors for extended period of time, also discussed wearing long sleeves, widebrimmed hat to protect skin as well.  Morbid obesity/weight loss counseling - she will continue phentermine at current dose.  Advised that phentermine is used as a booster to help jump start weight loss in addition to diet and exercise.  Plantar fasciitis - patient has had issues with this in the past, currently is feeling well but does have occasions where he will will ache off and on.  Would like to see podiatry  again for evaluation of steroid injection.  She will follow-up here in 1 month for recheck on weight loss on phentermine.  Advised she can return to clinic sooner if issues arise.

## 2018-09-22 NOTE — Telephone Encounter (Signed)
[facility]

## 2018-09-22 NOTE — Telephone Encounter (Signed)
Pt called [facility] [employer] for notification. See Telephone encounter for: 09/22/18.  Patient states that her insurance does not cover her phentermine (ADIPEX-P) 37.5 MG tablet at [facility] , [facility]  [id number] [place] [place] - [address] [place] [address]

## 2018-09-22 NOTE — Telephone Encounter (Signed)
Called Pt No answer, left a VM that [facility] [place]

## 2018-09-22 NOTE — Telephone Encounter (Signed)
Sent the message to Philis Nettle FNP

## 2018-09-22 NOTE — Telephone Encounter (Signed)
Copied from [place] [id number]. Topic: Quick Communication - See Telephone Encounter >> Sep 22, 2018 10:15 AM Vernona Rieger wrote: CRM for notification. See Telephone encounter for: 09/22/18.  Patient states that her insurance does not cover her phentermine (ADIPEX-P) 37.5 MG tablet at [facility] , [facility]  [id number] [place], [place] - [address] [place] [address]

## 2018-09-23 ENCOUNTER — Other Ambulatory Visit: Payer: Self-pay | Admitting: Lab

## 2018-09-23 ENCOUNTER — Telehealth: Payer: Self-pay | Admitting: Lab

## 2018-09-23 ENCOUNTER — Telehealth: Payer: Self-pay | Admitting: Family Medicine

## 2018-09-23 DIAGNOSIS — E538 Deficiency of other specified B group vitamins: Secondary | ICD-10-CM

## 2018-09-23 DIAGNOSIS — E559 Vitamin D deficiency, unspecified: Secondary | ICD-10-CM

## 2018-09-23 DIAGNOSIS — Z713 Dietary counseling and surveillance: Secondary | ICD-10-CM

## 2018-09-23 LAB — THYROID PANEL WITH TSH
Free Thyroxine Index: 2 (ref 1.4–3.8)
T3 Uptake: 28 % (ref 22–35)
T4, Total: 7.3 ug/dL (ref 5.1–11.9)
TSH: 2.36 mIU/L

## 2018-09-23 LAB — CYTOLOGY - PAP
Adequacy: ABSENT
Diagnosis: NEGATIVE

## 2018-09-23 MED ORDER — VITAMIN D (ERGOCALCIFEROL) 1.25 MG (50000 UNIT) PO CAPS: 50000.0000 [IU] | ORAL_CAPSULE | ORAL | 0 refills | Status: DC

## 2018-09-23 MED ORDER — CYANOCOBALAMIN 1000 MCG PO TABS: 1000.0000 ug | ORAL_TABLET | Freq: Every day | ORAL | 0 refills | Status: DC

## 2018-09-23 NOTE — Telephone Encounter (Signed)
Please correct her pharmacy in chart and send these meds to the correct pharmacy

## 2018-09-23 NOTE — Telephone Encounter (Signed)
Pt called [facility] [employer] for notification. See Telephone encounter for: 09/23/18. Patient calling and state that the cyanocobalamin 1000 MCG tablet  and the Vitamin D, Ergocalciferol, (DRISDOL) 1.25 MG (50000 UT) CAPS capsule were sent to the wrong pharmacy. States that they do not accept her insurance. States that she is needing the medications resent to [id number] - [place], [place] - [address] Please advise.

## 2018-09-23 NOTE — Telephone Encounter (Signed)
Switched pharmacy and resent Rx out

## 2018-09-23 NOTE — Telephone Encounter (Signed)
Copied from [place] [id number]. Topic: Quick Communication - See Telephone Encounter >> Sep 23, 2018  2:55 PM Rutherford Nail, NT wrote: CRM for notification. See Telephone encounter for: 09/23/18. Patient calling and state that the cyanocobalamin 1000 MCG tablet  and the Vitamin D, Ergocalciferol, (DRISDOL) 1.25 MG (50000 UT) CAPS capsule were sent to the wrong pharmacy. States that they do not accept her insurance. States that she is needing the medications [id number] - [place], [place] - [address] Please advise.

## 2018-09-23 NOTE — Addendum Note (Signed)
Addended by: Philis Nettle on: 09/23/2018 11:51 AM   Modules accepted: Orders

## 2018-09-24 NOTE — Telephone Encounter (Signed)
Res[facility] to [facility] [place] [address] 09/23/2018

## 2018-10-09 ENCOUNTER — Ambulatory Visit (INDEPENDENT_AMBULATORY_CARE_PROVIDER_SITE_OTHER): Payer: BLUE CROSS/BLUE SHIELD

## 2018-10-09 ENCOUNTER — Ambulatory Visit: Payer: BLUE CROSS/BLUE SHIELD | Admitting: Podiatry

## 2018-10-09 ENCOUNTER — Encounter: Payer: Self-pay | Admitting: Podiatry

## 2018-10-09 DIAGNOSIS — M722 Plantar fascial fibromatosis: Secondary | ICD-10-CM

## 2018-10-09 MED ORDER — MELOXICAM 15 MG PO TABS: 15.0000 mg | ORAL_TABLET | Freq: Every day | ORAL | 1 refills | Status: AC

## 2018-10-09 MED ORDER — METHYLPREDNISOLONE 4 MG PO TBPK: ORAL_TABLET | ORAL | 0 refills | Status: DC

## 2018-10-13 NOTE — Progress Notes (Signed)
   Subjective: 33 year old female presenting today as a new patient with a chief complaint of intermittent throbbing, aching pain that radiates throughout the bilateral heels that began 2-3 months ago. She states the left heel hurts worse than the right. She states she has been dealing with plantar fasciitis for the past several years and was being treated by a podiatrist in [facility]. She states the pain is worse in the morning when she first gets up and after working all day. She has been using OTC insoles, plantar fascial sleeves and has had cortisone injections for treatment. Patient is here for further evaluation and treatment.   Past Medical History:  Diagnosis Date  . Depression   . Medical history non-contributory      Objective: Physical Exam General: The patient is alert and oriented x3 in no acute distress.  Dermatology: Skin is warm, dry and supple bilateral lower extremities. Negative for open lesions or macerations bilateral.   Vascular: Dorsalis Pedis and Posterior Tibial pulses palpable bilateral.  Capillary fill time is immediate to all digits.  Neurological: Epicritic and protective threshold intact bilateral.   Musculoskeletal: Tenderness to palpation to the plantar aspect of the bilateral heels along the plantar fascia. All other joints range of motion within normal limits bilateral. Strength 5/5 in all groups bilateral.   Radiographic exam: Normal osseous mineralization. Joint spaces preserved. No fracture/dislocation/boney destruction. No other soft tissue abnormalities or radiopaque foreign bodies.   Assessment: 1. plantar fasciitis bilateral feet  Plan of Care:  1. Patient evaluated. Xrays reviewed.   2. Injection of 0.5cc Celestone soluspan injected into the bilateral heels.  3. Rx for Medrol Dose Pak placed 4. Rx for Meloxicam ordered for patient. 5. Continue using OTC insoles and wearing New Balance shoes.  6. Instructed patient regarding therapies and  modalities at home to alleviate symptoms.  7. Return to clinic in 4 weeks.    Works at [employer].   Edrick Kins, DPM [facility]  Dr. Edrick Kins, DPM    [address], [place] [address]                Office [phone]  Fax [phone]

## 2018-10-23 ENCOUNTER — Encounter: Payer: Self-pay | Admitting: Family Medicine

## 2018-10-23 ENCOUNTER — Ambulatory Visit: Payer: BLUE CROSS/BLUE SHIELD | Admitting: Family Medicine

## 2018-10-23 ENCOUNTER — Other Ambulatory Visit: Payer: Self-pay

## 2018-10-23 VITALS — BP 110/80 | HR 75 | Temp 98.2°F | Resp 16 | Ht 64.0 in | Wt 250.4 lb

## 2018-10-23 DIAGNOSIS — R638 Other symptoms and signs concerning food and fluid intake: Secondary | ICD-10-CM

## 2018-10-23 DIAGNOSIS — Z713 Dietary counseling and surveillance: Secondary | ICD-10-CM | POA: Diagnosis not present

## 2018-10-23 MED ORDER — PHENTERMINE HCL 37.5 MG PO TABS: 37.5000 mg | ORAL_TABLET | Freq: Every day | ORAL | 0 refills | Status: DC

## 2018-10-23 NOTE — Progress Notes (Signed)
Subjective:    Patient ID: Catherine Marks, female    DOB: 01-25-1986, 33 y.o.   MRN: [id number]  HPI  Patient presents to clinic for weight loss follow-up while taking phentermine.  Patient has had no issues with taking phentermine, denies any chest pain, palpitations, shortness of breath  Patient states she goes to the gym on occasion, but does not have any sort of consistent workout routine.  Reviewed patient's, and daily foods: States usually in the morning she will have coffee with creamer, sometimes cream and sugar, for lunch she will have a [place] sandwich or some sort of sandwich and potato chips or a hot dog, for dinner she will usually have some sort of pasta dish.  Patient Active Problem List   Diagnosis Date Noted  . Morbid obesity ([place]) 08/25/2018  . Weight loss counseling, encounter for 08/25/2018  . 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 Plantar fasciitis 02/02/2016   Social History   Tobacco Use  . Smoking status: Never Smoker  . Smokeless tobacco: Never Used  Substance Use Topics  . Alcohol use: Yes    Comment: socially   Review of Systems  Constitutional: Negative for chills, fatigue and fever.  HENT: Negative for congestion, ear pain, sinus pain and sore throat.   Eyes: Negative.   Respiratory: Negative for cough, shortness of breath and wheezing.   Cardiovascular: Negative for chest pain, palpitations and leg swelling.  Gastrointestinal: Negative for abdominal pain, diarrhea, nausea and vomiting.  Genitourinary: Negative for dysuria, frequency and urgency.  Musculoskeletal: Negative for arthralgias and myalgias.  Skin: Negative for color change, pallor and rash.  Neurological: Negative for syncope, light-headedness and headaches.  Psychiatric/Behavioral: The patient is not nervous/anxious.       Objective:   Physical Exam  Constitutional: She appears well-developed and well-nourished. No distress.  HENT:  Head: Normocephalic and atraumatic.   Eyes: Pupils are equal, round, and reactive to light. EOM are normal. No scleral icterus.  Neck: Normal range of motion. Neck supple. No tracheal deviation present.  Cardiovascular: Normal rate, regular rhythm and normal heart sounds.  Pulmonary/Chest: Effort normal and breath sounds normal. No respiratory distress. She has no wheezes. She has no rales.  Abdominal: Soft. Bowel sounds are normal. There is no tenderness.  Neurological: She is alert and oriented to person, place, and time.  Gait normal  Skin: Skin is warm and dry. No pallor.  Psychiatric: She has a normal mood and affect. Her behavior is normal. Thought content normal.   Nursing note and vitals reviewed.   Wt Readings from Last 3 Encounters:  09/22/18 250 lb 6.4 oz (113.6 kg)  08/25/18 252 lb 12.8 oz (114.7 kg)  11/23/12 280 lb 4.8 oz (127.1 kg)      Today's Vitals   10/23/18 0823  BP: 110/80  Pulse: 75  Resp: 16  Temp: 98.2 F (36.8 C)  TempSrc: Oral  SpO2: 96%  Weight: 250 lb 6.4 oz (113.6 kg)  Height: 5\' 4"  (1.626 m)   Body mass index is 42.98 kg/m.   Assessment & Plan:    A total of 25  minutes were spent face-to-face with the patient during this encounter and over half of that time was spent on counseling and coordination of care. The patient was counseled on weight loss.   Weight loss counseling/unable to lose weight- Long discussion with patient in regards to weight loss.  Patient strongly advised that phentermine is not a magic weight loss solution, she must follow  a diet and exercise regimen in order to lose weight.  A diet high in lean proteins like chicken or fish, lots of vegetables and low carb low sugar with lots of water is the best type of diet to follow in order to achieve weight loss.  Patient's current diet of sandwiches, chips, hot dogs and [place] will not help promote weight loss.  Patient advised I will do 1 more month of phentermine at the 37.5 mg dose, [facility] PMP registry reviewed  is appropriate.  Patient stated previously she had been on 37.5 mg 2 times daily; patient advised I am uncomfortable prescribing that high of a dose and that since she is not following an sort of strict diet plan that is most likely the reason as to why she is not losing weight.  Referral to bariatric surgery placed  Patient will follow-up here in 6 weeks for management and monitoring of weight loss.

## 2018-10-23 NOTE — Patient Instructions (Signed)
This is  Dr. Tullo's  example of a  "Low GI"  Diet:  It will allow you to lose 4 to 8  lbs  per month if you follow it carefully.  Your goal with exercise is a minimum of 30 minutes of aerobic exercise 5 days per week (Walking does not count once it becomes easy!)    All of the foods can be found at grocery stores and in bulk at [employer].  The Atkins protein bars and shakes are available in more varieties at Target, WalMart and [employer]'s [employer].     7 AM Breakfast:  Choose from the following:  Low carbohydrate Protein  Shakes (I recommend the  Premier Protein chocolate shakes,  EAS AdvantEdge "Carb Control" shakes  Or the Atkins shakes all are under 3 net carbs)     a scrambled egg/bacon/cheese burrito made with Mission's "carb balance" whole wheat tortilla  (about 10 net carbs )  [employer] sells microwaveable frittata (basically a quiche without the pastry crust) that is eaten cold and very convenient way to get your eggs.  8 carbs)  If you make your own protein shakes, avoid bananas and pineapple,  And use low carb greek yogurt or original /unsweetened almond or soy milk    Avoid cereal and bananas, oatmeal and cream of wheat and grits. They are loaded with carbohydrates!   10 AM: high protein snack:  Protein bar by Atkins (the snack size, under 200 cal, usually < 6 net carbs).    A stick of cheese:  Around 1 carb,  100 cal     Dannon Light n Fit Greek Yogurt  (80 cal, 8 carbs)  Other so called "protein bars" and Greek yogurts tend to be loaded with carbohydrates.  Remember, in food advertising, the word "energy" is synonymous for " carbohydrate."  Lunch:   A Sandwich using the bread choices listed, Can use any  Eggs,  lunchmeat, grilled meat or canned tuna), avocado, regular mayo/mustard  and cheese.  A Salad using blue cheese, ranch,  Goddess or vinagrette,  Avoid taco shells, croutons or "confetti" and no "candied nuts" but regular nuts OK.   No pretzels, nabs  or chips.  Pickles and  miniature sweet peppers are a good low carb alternative that provide a "crunch"  The bread is the only source of carbohydrate in a sandwich and  can be decreased by trying some of the attached alternatives to traditional loaf bread   Avoid "Low fat dressings, as well as [place] and [place] dressings They are loaded with sugar!   3 PM/ Mid day  Snack:  Consider  1 ounce of  almonds, walnuts, pistachios, pecans, peanuts,  Macadamia nuts or a nut medley.  Avoid "granola and granola bars "  Mixed nuts are ok in moderation as long as there are no raisins,  cranberries or dried fruit.   KIND bars are OK if you get the low glycemic index variety   Try the prosciutto/mozzarella cheese sticks by Fiorruci  In deli /backery section   High protein      6 PM  Dinner:     Meat/fowl/fish with a green salad, and either broccoli, cauliflower, green beans, spinach, brussel sprouts or  Lima beans. DO NOT BREAD THE PROTEIN!!      There is a low carb pasta by Dreamfield's that is acceptable and tastes great: only 5 digestible carbs/serving.( All grocery stores but BJs carry it ) Several ready made meals are   available low carb:   Try Michel Angelo's chicken piccata or chicken or eggplant parm over low carb pasta.(Lowes and BJs)   Aaron Sanchez's "Carnitas" (pulled pork, no sauce,  0 carbs) or his beef pot roast to make a dinner burrito (at BJ's)  Pesto over low carb pasta (bj's sells a good quality pesto in the center refrigerated section of the deli   Try satueeing  Bok Choy with mushroooms as a good side   Green Giant makes a mashed cauliflower that tastes like mashed potatoes  Whole wheat pasta is still full of digestible carbs and  Not as low in glycemic index as Dreamfield's.   Brown rice is still rice,  So skip the rice and noodles if you eat Chinese or Thai (or at least limit to 1/2 cup)  9 PM snack :   Breyer's "low carb" fudgsicle or  ice cream bar (Carb Smart line), or  Weight Watcher's ice  cream bar , or another "no sugar added" ice cream;  a serving of fresh berries/cherries with whipped cream   Cheese or DANNON'S LlGHT N FIT GREEK YOGURT  8 ounces of [place] unsweetened almond/cococunut milk    Treat yourself to a parfait made with whipped cream blueberiies, walnuts and vanilla greek yogurt  Avoid bananas, pineapple, grapes  and watermelon on a regular basis because they are high in sugar.  THINK OF THEM AS DESSERT  Remember that snack Substitutions should be less than 10 NET carbs per serving and meals < 20 carbs. Remember to subtract fiber grams to get the "net carbs."   

## 2018-11-10 ENCOUNTER — Ambulatory Visit: Payer: BLUE CROSS/BLUE SHIELD | Admitting: Podiatry

## 2018-11-25 ENCOUNTER — Telehealth: Payer: Self-pay | Admitting: *Deleted

## 2018-11-25 NOTE — Telephone Encounter (Signed)
Copied from [place] [id number]. Topic: Referral - Status >> Nov 25, 2018  8:54 AM Lennox Solders wrote: Reason for [id number]:[id number] from [place] [facility] is calling they do not provide weight loss counseling. Patient can be refer to dr Redgie Grayer [phone]

## 2018-11-25 NOTE — Telephone Encounter (Signed)
Melissa,  The referral I put in is for bariatric surgery -- would I need to change it for her to see Dr Redgie Grayer?  Thanks,  Ander Purpura

## 2018-11-26 NOTE — Telephone Encounter (Signed)
Nope, I changed it to [employer]. MT

## 2018-11-26 NOTE — Telephone Encounter (Signed)
Thanks

## 2018-12-01 ENCOUNTER — Encounter (INDEPENDENT_AMBULATORY_CARE_PROVIDER_SITE_OTHER): Payer: Self-pay

## 2018-12-01 ENCOUNTER — Other Ambulatory Visit: Payer: Self-pay | Admitting: Family Medicine

## 2018-12-01 DIAGNOSIS — Z713 Dietary counseling and surveillance: Secondary | ICD-10-CM

## 2018-12-01 DIAGNOSIS — R638 Other symptoms and signs concerning food and fluid intake: Secondary | ICD-10-CM

## 2018-12-01 NOTE — Telephone Encounter (Signed)
Last OV 10/23/2018  Last refilled  10/23/2018 disp 30 with no refills   Next OV  12/10/2018  Sent to PCP for approval

## 2018-12-08 ENCOUNTER — Telehealth: Payer: Self-pay | Admitting: *Deleted

## 2018-12-08 NOTE — Telephone Encounter (Signed)
Copied from [place] [id number]. Topic: Quick Communication - Appointment Cancellation >> Dec 07, 2018  6:37 PM Loma [place] wrote: Patient called to cancel appointment scheduled for 4/30 after hrs. She does not want to resch.  will contact office when she is ready resch  Route to department's PEC pool.

## 2018-12-08 NOTE — Telephone Encounter (Signed)
Pt called Pec Patient called to cancel appointment scheduled for 4/30 after hrs. She does not want to resch.  will contact office when she is ready resch  Route to department's PEC pool

## 2018-12-08 NOTE — Telephone Encounter (Signed)
Im confused  Appointment cancelled on 4/30 then?

## 2018-12-10 ENCOUNTER — Ambulatory Visit: Payer: BLUE CROSS/BLUE SHIELD | Admitting: Family Medicine

## 2018-12-23 ENCOUNTER — Other Ambulatory Visit: Payer: Self-pay | Admitting: Family Medicine

## 2018-12-23 DIAGNOSIS — E559 Vitamin D deficiency, unspecified: Secondary | ICD-10-CM

## 2019-02-10 ENCOUNTER — Other Ambulatory Visit: Payer: Self-pay | Admitting: Family Medicine

## 2019-02-10 DIAGNOSIS — R638 Other symptoms and signs concerning food and fluid intake: Secondary | ICD-10-CM

## 2019-02-10 DIAGNOSIS — Z713 Dietary counseling and surveillance: Secondary | ICD-10-CM

## 2019-06-29 DIAGNOSIS — M62830 Muscle spasm of back: Secondary | ICD-10-CM | POA: Diagnosis not present

## 2019-06-29 DIAGNOSIS — M9906 Segmental and somatic dysfunction of lower extremity: Secondary | ICD-10-CM | POA: Diagnosis not present

## 2019-06-29 DIAGNOSIS — M9902 Segmental and somatic dysfunction of thoracic region: Secondary | ICD-10-CM | POA: Diagnosis not present

## 2019-06-29 DIAGNOSIS — M62452 Contracture of muscle, left thigh: Secondary | ICD-10-CM | POA: Diagnosis not present

## 2019-06-29 DIAGNOSIS — M9903 Segmental and somatic dysfunction of lumbar region: Secondary | ICD-10-CM | POA: Diagnosis not present

## 2019-06-29 DIAGNOSIS — R293 Abnormal posture: Secondary | ICD-10-CM | POA: Diagnosis not present

## 2019-06-30 DIAGNOSIS — R293 Abnormal posture: Secondary | ICD-10-CM | POA: Diagnosis not present

## 2019-06-30 DIAGNOSIS — M9906 Segmental and somatic dysfunction of lower extremity: Secondary | ICD-10-CM | POA: Diagnosis not present

## 2019-06-30 DIAGNOSIS — M9902 Segmental and somatic dysfunction of thoracic region: Secondary | ICD-10-CM | POA: Diagnosis not present

## 2019-06-30 DIAGNOSIS — M9903 Segmental and somatic dysfunction of lumbar region: Secondary | ICD-10-CM | POA: Diagnosis not present

## 2019-06-30 DIAGNOSIS — M62830 Muscle spasm of back: Secondary | ICD-10-CM | POA: Diagnosis not present

## 2019-06-30 DIAGNOSIS — M62452 Contracture of muscle, left thigh: Secondary | ICD-10-CM | POA: Diagnosis not present

## 2019-11-07 ENCOUNTER — Ambulatory Visit: Payer: BC Managed Care – PPO | Attending: Internal Medicine

## 2019-11-07 DIAGNOSIS — Z23 Encounter for immunization: Secondary | ICD-10-CM

## 2019-11-07 NOTE — Progress Notes (Signed)
   Covid-19 Vaccination Clinic  Name:  Catherine Marks    MRN: [id number] DOB: 1986-08-05  11/07/2019  [name] was observed post Covid-19 immunization for 15 minutes without incident. She was provided with Vaccine Information Sheet and instruction to access the V-Safe system.   [name] was instructed to call 911 with any severe reactions post vaccine: Marland Kitchen Difficulty breathing  . Swelling of face and throat  . A fast heartbeat  . A bad rash all over body  . Dizziness and weakness   Immunizations Administered    Name Date Dose VIS Date Route   Pfizer COVID-19 Vaccine 11/07/2019  2:01 PM 0.3 mL 07/23/2019 Intramuscular   Manufacturer: [place]   Lot: [id number]   [place]: [id number]

## 2019-11-30 ENCOUNTER — Ambulatory Visit: Payer: BC Managed Care – PPO | Attending: Internal Medicine

## 2019-11-30 DIAGNOSIS — Z23 Encounter for immunization: Secondary | ICD-10-CM

## 2019-11-30 NOTE — Progress Notes (Signed)
   Covid-19 Vaccination Clinic  Name:  Kanecia Chick    MRN: [id number] DOB: [birth year]  11/30/2019  Ms. Sepe was observed post Covid-19 immunization for 15 minutes without incident. She was provided with Vaccine Information Sheet and instruction to access the V-Safe system.   Ms. Mallicoat was instructed to call 911 with any severe reactions post vaccine: Marland Kitchen Difficulty breathing  . Swelling of face and throat  . A fast heartbeat  . A bad rash all over body  . Dizziness and weakness   Immunizations Administered    Name Date Dose VIS Date Route   Pfizer COVID-19 Vaccine 11/30/2019  2:34 PM 0.3 mL 10/06/2018 Intramuscular   Manufacturer: [place]   Lot: [id number]   [place]: [id number]

## 2019-12-10 DIAGNOSIS — M722 Plantar fascial fibromatosis: Secondary | ICD-10-CM | POA: Diagnosis not present

## 2019-12-14 ENCOUNTER — Ambulatory Visit (INDEPENDENT_AMBULATORY_CARE_PROVIDER_SITE_OTHER): Payer: BC Managed Care – PPO

## 2019-12-14 ENCOUNTER — Other Ambulatory Visit: Payer: Self-pay

## 2019-12-14 ENCOUNTER — Ambulatory Visit: Payer: BLUE CROSS/BLUE SHIELD | Admitting: Podiatry

## 2019-12-14 DIAGNOSIS — M7731 Calcaneal spur, right foot: Secondary | ICD-10-CM

## 2019-12-14 DIAGNOSIS — M79672 Pain in left foot: Secondary | ICD-10-CM | POA: Diagnosis not present

## 2019-12-14 DIAGNOSIS — M79671 Pain in right foot: Secondary | ICD-10-CM | POA: Diagnosis not present

## 2019-12-14 DIAGNOSIS — M722 Plantar fascial fibromatosis: Secondary | ICD-10-CM | POA: Diagnosis not present

## 2019-12-14 DIAGNOSIS — M7732 Calcaneal spur, left foot: Secondary | ICD-10-CM

## 2019-12-15 ENCOUNTER — Encounter: Payer: Self-pay | Admitting: Podiatry

## 2019-12-15 NOTE — Progress Notes (Signed)
Subjective:  Patient ID: Catherine Marks, female    DOB: 1986-04-02,  MRN: [id number]  Chief Complaint  Patient presents with  . Foot Pain    pt is here for bil foot pain, pt states that foot pain has been going on for about a year, pt has recieved precious injections, pt has no other comments or concerns    34 y.o. female presents with the above complaint.  Patient presents with bilateral heel pain that has been going for about 4 years.  Is painful to walk on.  She is experiencing post static dyskinesia type of symptoms.  Patient states she has received past cortisone injection but has not helped much.  Her pain scale is 9 out of 10.  Pain is elevated after work.  She denies any other acute complaints.  Her pain scale shooting throbbing nature.   Review of Systems: Negative except as noted in the HPI. Denies N/V/F/Ch.  Past Medical History:  Diagnosis Date  . Depression   . Medical history non-contributory     Current Outpatient Medications:  .  cyanocobalamin 1000 MCG tablet, Take 1 tablet (1,000 mcg total) by mouth daily., Disp: 90 tablet, Rfl: 0 .  methylPREDNISolone (MEDROL DOSEPAK) 4 MG TBPK tablet, 6 day dose pack - take as directed, Disp: 21 tablet, Rfl: 0 .  phentermine (ADIPEX-P) 37.5 MG tablet, TAKE 1 TABLET BY MOUTH DAILY BEFORE BREAKFAST, Disp: 30 tablet, Rfl: 0 .  Vitamin D, Ergocalciferol, (DRISDOL) 1.25 MG (50000 UT) CAPS capsule, TAKE 1 CAPSULE (50,000 UNITS TOTAL) BY MOUTH EVERY 7 (SEVEN) DAYS., Disp: 4 capsule, Rfl: 2  Social History   Tobacco Use  Smoking Status Never Smoker  Smokeless Tobacco Never Used    Allergies  Allergen Reactions  . Fish Allergy     Cod fish  . Milk-Related Compounds   . Other   . Poultry Meal     [place]   . Wheat Bran    Objective:  There were no vitals filed for this visit. There is no height or weight on file to calculate BMI. Constitutional Well developed. Well nourished.  Vascular Dorsalis pedis pulses palpable  bilaterally. Posterior tibial pulses palpable bilaterally. Capillary refill normal to all digits.  No cyanosis or clubbing noted. Pedal hair growth normal.  Neurologic Normal speech. Oriented to person, place, and time. Epicritic sensation to light touch grossly present bilaterally.  Dermatologic Nails well groomed and normal in appearance. No open wounds. No skin lesions.  Orthopedic: Normal joint ROM without pain or crepitus bilaterally. No visible deformities. Tender to palpation at the calcaneal tuber bilaterally. No pain with calcaneal squeeze bilaterally. Ankle ROM diminished range of motion bilaterally. Silfverskiold Test: positive bilaterally.   Radiographs: Taken and reviewed. No acute fractures or dislocations. No evidence of stress fracture.  Plantar heel spur present. Posterior heel spur present.   Assessment:   1. Plantar fasciitis, left   2. Plantar fasciitis of right foot   3. Pain of right heel   4. Pain of left heel   5. Heel spur, left   6. Heel spur, right    Plan:  Patient was evaluated and treated and all questions answered.  Plantar Fasciitis, bilaterally - XR reviewed as above.  - Educated on icing and stretching. Instructions given.  - Injection delivered to the plantar fascia as below. - DME: Plantar Fascial Brace x2 - Pharmacologic management: Meloxicam/Medrol Dose Pak. Educated on risks/benefits and proper taking of medication.  Procedure: Injection Tendon/Ligament Location: Bilateral plantar fascia at  the glabrous junction; medial approach. Skin Prep: alcohol Injectate: 0.5 cc 0.5% marcaine plain, 0.5 cc of 1% Lidocaine, 0.5 cc kenalog 10. Disposition: Patient tolerated procedure well. Injection site dressed with a band-aid.  No follow-ups on file.

## 2019-12-31 ENCOUNTER — Telehealth: Payer: BC Managed Care – PPO | Admitting: Emergency Medicine

## 2019-12-31 DIAGNOSIS — J029 Acute pharyngitis, unspecified: Secondary | ICD-10-CM

## 2019-12-31 NOTE — Progress Notes (Signed)

## 2020-01-12 ENCOUNTER — Telehealth: Payer: BC Managed Care – PPO | Admitting: Physician Assistant

## 2020-01-12 DIAGNOSIS — J019 Acute sinusitis, unspecified: Secondary | ICD-10-CM | POA: Diagnosis not present

## 2020-01-12 MED ORDER — AMOXICILLIN-POT CLAVULANATE 875-125 MG PO TABS
1.0000 | ORAL_TABLET | Freq: Two times a day (BID) | ORAL | 0 refills | Status: DC
Start: 2020-01-12 — End: 2020-11-14

## 2020-01-12 NOTE — Progress Notes (Signed)

## 2020-01-25 ENCOUNTER — Ambulatory Visit: Payer: BC Managed Care – PPO | Admitting: Podiatry

## 2020-01-25 ENCOUNTER — Encounter: Payer: Self-pay | Admitting: Podiatry

## 2020-01-25 ENCOUNTER — Other Ambulatory Visit: Payer: Self-pay

## 2020-01-25 DIAGNOSIS — Q666 Other congenital valgus deformities of feet: Secondary | ICD-10-CM

## 2020-01-25 DIAGNOSIS — M722 Plantar fascial fibromatosis: Secondary | ICD-10-CM | POA: Diagnosis not present

## 2020-01-25 NOTE — Progress Notes (Signed)
Subjective:  Patient ID: Catherine Marks, female    DOB: 11/12/1985,  MRN: [id number]  Chief Complaint  Patient presents with  . Plantar Fasciitis    "its doing a little better, its not 100 percent yet and my right foot is alot worse"    34 y.o. female presents with the above complaint.  Patient presents with a follow-up of bilateral plantar fasciitis.  Patient states the injection did help a lot.  It is not 100% however the injection did help.  Plantar fascial braces did not help.  She has been doing stretching exercises which helped considerably as well.  She would like to discuss further treatment options As well as long-term management.  Review of Systems: Negative except as noted in the HPI. Denies N/V/F/Ch.  Past Medical History:  Diagnosis Date  . Depression   . Medical history non-contributory     Current Outpatient Medications:  .  amoxicillin-clavulanate (AUGMENTIN) 875-125 MG tablet, Take 1 tablet by mouth 2 (two) times daily. One po bid x 7 days, Disp: 14 tablet, Rfl: 0 .  cyanocobalamin 1000 MCG tablet, Take 1 tablet (1,000 mcg total) by mouth daily., Disp: 90 tablet, Rfl: 0 .  methylPREDNISolone (MEDROL DOSEPAK) 4 MG TBPK tablet, 6 day dose pack - take as directed, Disp: 21 tablet, Rfl: 0 .  phentermine (ADIPEX-P) 37.5 MG tablet, TAKE 1 TABLET BY MOUTH DAILY BEFORE BREAKFAST, Disp: 30 tablet, Rfl: 0 .  Vitamin D, Ergocalciferol, (DRISDOL) 1.25 MG (50000 UT) CAPS capsule, TAKE 1 CAPSULE (50,000 UNITS TOTAL) BY MOUTH EVERY 7 (SEVEN) DAYS., Disp: 4 capsule, Rfl: 2  Social History   Tobacco Use  Smoking Status Never Smoker  Smokeless Tobacco Never Used    Allergies  Allergen Reactions  . Fish Allergy     Cod fish  . Milk-Related Compounds   . Other   . Poultry Meal     [place]   . Wheat Bran    Objective:  There were no vitals filed for this visit. There is no height or weight on file to calculate BMI. Constitutional Well developed. Well nourished.  Vascular  Dorsalis pedis pulses palpable bilaterally. Posterior tibial pulses palpable bilaterally. Capillary refill normal to all digits.  No cyanosis or clubbing noted. Pedal hair growth normal.  Neurologic Normal speech. Oriented to person, place, and time. Epicritic sensation to light touch grossly present bilaterally.  Dermatologic Nails well groomed and normal in appearance. No open wounds. No skin lesions.  Orthopedic: Normal joint ROM without pain or crepitus bilaterally. No visible deformities. Tender to palpation at the calcaneal tuber bilaterally. No pain with calcaneal squeeze bilaterally. Ankle ROM diminished range of motion bilaterally. Silfverskiold Test: positive bilaterally.   Radiographs: Taken and reviewed. No acute fractures or dislocations. No evidence of stress fracture.  Plantar heel spur present. Posterior heel spur present.   Assessment:   No diagnosis found. Plan:  Patient was evaluated and treated and all questions answered.  Plantar Fasciitis, right greater than left - XR reviewed as above.  - Educated on icing and stretching. Instructions given.  -Second injection delivered to the plantar fascia as below. - DME: Bilateral night splint x2 - Pharmacologic management: None  Pes planovalgus bilateral -I explained the patient the etiology of pes planovalgus and its relationship with plantar fasciitis and various treatment options were discussed.  I believe patient will benefit from custom-made orthotics to help control the hindfoot motion as well as support the arches of the foot.  She will be scheduled to  see Rick for custom-made orthotics.  Procedure: Injection Tendon/Ligament Location: Bilateral plantar fascia at the glabrous junction; medial approach. Skin Prep: alcohol Injectate: 0.5 cc 0.5% marcaine plain, 0.5 cc of 1% Lidocaine, 0.5 cc kenalog 10. Disposition: Patient tolerated procedure well. Injection site dressed with a band-aid.  No follow-ups on  file.

## 2020-02-16 ENCOUNTER — Other Ambulatory Visit: Payer: Self-pay

## 2020-02-16 ENCOUNTER — Ambulatory Visit (INDEPENDENT_AMBULATORY_CARE_PROVIDER_SITE_OTHER): Payer: BC Managed Care – PPO | Admitting: Orthotics

## 2020-02-16 DIAGNOSIS — M722 Plantar fascial fibromatosis: Secondary | ICD-10-CM

## 2020-02-16 NOTE — Progress Notes (Signed)

## 2020-02-24 ENCOUNTER — Ambulatory Visit: Payer: BC Managed Care – PPO | Admitting: Podiatry

## 2020-02-24 ENCOUNTER — Other Ambulatory Visit: Payer: Self-pay

## 2020-02-24 ENCOUNTER — Encounter: Payer: Self-pay | Admitting: Podiatry

## 2020-02-24 DIAGNOSIS — Q666 Other congenital valgus deformities of feet: Secondary | ICD-10-CM

## 2020-02-24 DIAGNOSIS — M722 Plantar fascial fibromatosis: Secondary | ICD-10-CM

## 2020-02-24 NOTE — Progress Notes (Signed)
Subjective:  Patient ID: Catherine Marks, female    DOB: 1986/01/29,  MRN: [id number]  Chief Complaint  Patient presents with  . Plantar Fasciitis    "they are about 95 percent better'    34 y.o. female presents with the above complaint.  Patient presents with bilateral plantar fasciitis.  Patient states the injection helped considerably.  Her pain has completely resolved.  She does not have any further pain.  She has made shoe gear modification she is awaiting orthotics.  She has been wearing her braces which has helped tremendously.  Review of Systems: Negative except as noted in the HPI. Denies N/V/F/Ch.  Past Medical History:  Diagnosis Date  . Depression   . Medical history non-contributory     Current Outpatient Medications:  .  amoxicillin-clavulanate (AUGMENTIN) 875-125 MG tablet, Take 1 tablet by mouth 2 (two) times daily. One po bid x 7 days, Disp: 14 tablet, Rfl: 0 .  cyanocobalamin 1000 MCG tablet, Take 1 tablet (1,000 mcg total) by mouth daily., Disp: 90 tablet, Rfl: 0 .  methylPREDNISolone (MEDROL DOSEPAK) 4 MG TBPK tablet, 6 day dose pack - take as directed, Disp: 21 tablet, Rfl: 0 .  phentermine (ADIPEX-P) 37.5 MG tablet, TAKE 1 TABLET BY MOUTH DAILY BEFORE BREAKFAST, Disp: 30 tablet, Rfl: 0 .  Vitamin D, Ergocalciferol, (DRISDOL) 1.25 MG (50000 UT) CAPS capsule, TAKE 1 CAPSULE (50,000 UNITS TOTAL) BY MOUTH EVERY 7 (SEVEN) DAYS., Disp: 4 capsule, Rfl: 2  Social History   Tobacco Use  Smoking Status Never Smoker  Smokeless Tobacco Never Used    Allergies  Allergen Reactions  . Fish Allergy     Cod fish  . Milk-Related Compounds   . Other   . Poultry Meal     [place]   . Wheat Bran    Objective:  There were no vitals filed for this visit. There is no height or weight on file to calculate BMI. Constitutional Well developed. Well nourished.  Vascular Dorsalis pedis pulses palpable bilaterally. Posterior tibial pulses palpable bilaterally. Capillary refill  normal to all digits.  No cyanosis or clubbing noted. Pedal hair growth normal.  Neurologic Normal speech. Oriented to person, place, and time. Epicritic sensation to light touch grossly present bilaterally.  Dermatologic Nails well groomed and normal in appearance. No open wounds. No skin lesions.  Orthopedic: Normal joint ROM without pain or crepitus bilaterally. No visible deformities. No tender to palpation at the calcaneal tuber bilaterally. No pain with calcaneal squeeze bilaterally. Ankle ROM diminished range of motion bilaterally. Silfverskiold Test: positive bilaterally.   Radiographs: Taken and reviewed. No acute fractures or dislocations. No evidence of stress fracture.  Plantar heel spur present. Posterior heel spur present.   Assessment:   1. Plantar fasciitis, left   2. Plantar fasciitis of right foot   3. Pes planovalgus    Plan:  Patient was evaluated and treated and all questions answered.  Plantar Fasciitis, right greater than left -Clinically patient is doing 95% better.  She does not have any further pain.  She has been able to do her normal activities.  She is awaiting orthotics.  I instructed her and educated her on the importance of orthotics management for long-term control of plantar fasciitis.  Patient states understanding. -I have asked her to come back and see me if any foot and ankle issues arises in the future  Pes planovalgus bilateral -I explained the patient the etiology of pes planovalgus and its relationship with plantar fasciitis and various treatment  options were discussed.  I believe patient will benefit from custom-made orthotics to help control the hindfoot motion as well as support the arches of the foot.  -Patient has been casted for orthotics.  Patient is awaiting orthotics.   No follow-ups on file.

## 2020-03-16 ENCOUNTER — Other Ambulatory Visit: Payer: Self-pay

## 2020-03-16 ENCOUNTER — Ambulatory Visit (INDEPENDENT_AMBULATORY_CARE_PROVIDER_SITE_OTHER): Payer: BC Managed Care – PPO | Admitting: Orthotics

## 2020-03-16 DIAGNOSIS — Q666 Other congenital valgus deformities of feet: Secondary | ICD-10-CM

## 2020-03-16 DIAGNOSIS — M722 Plantar fascial fibromatosis: Secondary | ICD-10-CM | POA: Diagnosis not present

## 2020-03-16 NOTE — Progress Notes (Signed)
Patient came in today to p/up functional foot orthotics.   The orthotics were assessed to both fit and function.  The F/O addressed the biomechanical issues/pathologies as intended, offering good longitudinal arch support, proper offloading, and foot support. There weren't any signs of discomfort or irritation.  The F/O fit properly in footwear with minimal trimming/adjustments. 

## 2020-05-12 DIAGNOSIS — Z20822 Contact with and (suspected) exposure to covid-19: Secondary | ICD-10-CM | POA: Diagnosis not present

## 2020-10-15 ENCOUNTER — Telehealth: Payer: BC Managed Care – PPO | Admitting: Family

## 2020-10-15 DIAGNOSIS — J301 Allergic rhinitis due to pollen: Secondary | ICD-10-CM | POA: Diagnosis not present

## 2020-10-15 MED ORDER — LEVOCETIRIZINE DIHYDROCHLORIDE 5 MG PO TABS: 5.0000 mg | ORAL_TABLET | Freq: Every evening | ORAL | 1 refills | Status: DC

## 2020-10-15 NOTE — Progress Notes (Signed)
E visit for Allergic Rhinitis We are sorry that you are not feeling well.  Here is how we plan to help!  Based on what you have shared with me it looks like you have Allergic Rhinitis.  Rhinitis is when a reaction occurs that causes nasal congestion, runny nose, sneezing, and itching.  Most types of rhinitis are caused by an inflammation and are associated with symptoms in the eyes ears or throat. There are several types of rhinitis.  The most common are acute rhinitis, which is usually caused by a viral illness, allergic or seasonal rhinitis, and nonallergic or year-round rhinitis.  Nasal allergies occur certain times of the year.  Allergic rhinitis is caused when allergens in the air trigger the release of histamine in the body.  Histamine causes itching, swelling, and fluid to build up in the fragile linings of the nasal passages, sinuses and eyelids.  An itchy nose and clear discharge are common.  I recommend the following over the counter treatments: Xyzal 5 mg take 1 tablet daily  I also would recommend a nasal spray: Saline 1 spray into each nostril as needed   HOME CARE:   You can use an over-the-counter saline nasal spray as needed  Avoid areas where there is heavy dust, mites, or molds  Stay indoors on windy days during the pollen season  Keep windows closed in home, at least in bedroom; use air conditioner.  Use high-efficiency house air filter  Keep windows closed in car, turn AC on re-circulate  Avoid playing out with dog during pollen season  GET HELP RIGHT AWAY IF:   If your symptoms do not improve within 10 days  You become short of breath  You develop yellow or green discharge from your nose for over 3 days  You have coughing fits  MAKE SURE YOU:   Understand these instructions  Will watch your condition  Will get help right away if you are not doing well or get worse  Thank you for choosing an e-visit. Your e-visit answers were reviewed by a board  certified advanced clinical practitioner to complete your personal care plan. Depending upon the condition, your plan could have included both over the counter or prescription medications. Please review your pharmacy choice. Be sure that the pharmacy you have chosen is open so that you can pick up your prescription now.  If there is a problem you may message your provider in [place] to have the prescription routed to another pharmacy. Your safety is important to [place]. If you have drug allergies check your prescription carefully.  For the next 24 hours, you can use MyChart to ask questions about today's visit, request a non-urgent call back, or ask for a work or school excuse from your e-visit provider. You will get an email in the next two days asking about your experience. I hope that your e-visit has been valuable and will speed your recovery.    Approximately 5 minutes was spent documenting and reviewing patient's chart.

## 2020-11-14 ENCOUNTER — Ambulatory Visit: Payer: BC Managed Care – PPO | Admitting: Podiatry

## 2020-11-14 ENCOUNTER — Encounter: Payer: Self-pay | Admitting: Podiatry

## 2020-11-14 ENCOUNTER — Other Ambulatory Visit: Payer: Self-pay

## 2020-11-14 DIAGNOSIS — M7731 Calcaneal spur, right foot: Secondary | ICD-10-CM | POA: Diagnosis not present

## 2020-11-14 DIAGNOSIS — M722 Plantar fascial fibromatosis: Secondary | ICD-10-CM

## 2020-11-14 DIAGNOSIS — E8881 Metabolic syndrome: Secondary | ICD-10-CM | POA: Insufficient documentation

## 2020-11-14 DIAGNOSIS — M7732 Calcaneal spur, left foot: Secondary | ICD-10-CM

## 2020-11-14 DIAGNOSIS — J111 Influenza due to unidentified influenza virus with other respiratory manifestations: Secondary | ICD-10-CM | POA: Insufficient documentation

## 2020-11-14 NOTE — Progress Notes (Signed)
  Subjective:  Patient ID: Catherine Marks, female    DOB: Mar 08, 1986,  MRN: [id number]  Chief Complaint  Patient presents with  . Plantar Fasciitis    Patient comes in today with ongoing plantar fascial pain Rt > Lt.  She says she got her orthotics back in August last year and the pain has progressively gotten worse since getting the orthotics    35 y.o. female presents with the above complaint.  Patient presents with recurrence of plantar fasciitis pain.  Right greater than left side.  Patient states that it has progressively gotten worse.  She has been wearing orthotics has been doing some stretching but is still very painful.  The right side still worse on the left side.  She would like to discuss treatment options.  She denies any other acute complaints.   Review of Systems: Negative except as noted in the HPI. Denies N/V/F/Ch.  Past Medical History:  Diagnosis Date  . Depression   . Medical history non-contributory     Current Outpatient Medications:  .  Acetaminophen-Codeine 300-30 MG tablet, Take 1 tablet by mouth every 6 (six) hours as needed., Disp: , Rfl:  .  ibuprofen (ADVIL) 400 MG tablet, Take 400 mg by mouth 3 (three) times daily as needed., Disp: , Rfl:  .  levocetirizine (XYZAL) 5 MG tablet, Take 1 tablet (5 mg total) by mouth every evening., Disp: 90 tablet, Rfl: 1  Social History   Tobacco Use  Smoking Status Never Smoker  Smokeless Tobacco Never Used    Allergies  Allergen Reactions  . Fish Allergy     Cod fish  . Milk-Related Compounds   . Other   . Poultry Meal     [place]   . Wheat Bran    Objective:  There were no vitals filed for this visit. There is no height or weight on file to calculate BMI. Constitutional Well developed. Well nourished.  Vascular Dorsalis pedis pulses palpable bilaterally. Posterior tibial pulses palpable bilaterally. Capillary refill normal to all digits.  No cyanosis or clubbing noted. Pedal hair growth normal.   Neurologic Normal speech. Oriented to person, place, and time. Epicritic sensation to light touch grossly present bilaterally.  Dermatologic Nails well groomed and normal in appearance. No open wounds. No skin lesions.  Orthopedic: Normal joint ROM without pain or crepitus bilaterally. No visible deformities. Tender to palpation at the calcaneal tuber bilaterally. No pain with calcaneal squeeze bilaterally. Ankle ROM diminished range of motion bilaterally. Silfverskiold Test: positive bilaterally.   Radiographs: Taken and reviewed. No acute fractures or dislocations. No evidence of stress fracture.  Plantar heel spur present. Posterior heel spur present.   Assessment:   1. Heel spur, left   2. Heel spur, right   3. Plantar fasciitis, left   4. Plantar fasciitis of right foot    Plan:  Patient was evaluated and treated and all questions answered.  Plantar Fasciitis, bilaterally~recurrence - XR reviewed as above.  - Educated on icing and stretching. Instructions given.  - Injection delivered to the plantar fascia as below. - DME: Plantar Fascial Brace x2 - Pharmacologic management: Meloxicam/Medrol Dose Pak. Educated on risks/benefits and proper taking of medication.  Procedure: Injection Tendon/Ligament Location: Bilateral plantar fascia at the glabrous junction; medial approach. Skin Prep: alcohol Injectate: 0.5 cc 0.5% marcaine plain, 0.5 cc of 1% Lidocaine, 0.5 cc kenalog 10. Disposition: Patient tolerated procedure well. Injection site dressed with a band-aid.  No follow-ups on file.

## 2020-12-14 ENCOUNTER — Other Ambulatory Visit: Payer: Self-pay

## 2020-12-14 ENCOUNTER — Encounter: Payer: Self-pay | Admitting: Podiatry

## 2020-12-14 ENCOUNTER — Ambulatory Visit: Payer: BC Managed Care – PPO | Admitting: Podiatry

## 2020-12-14 DIAGNOSIS — G5790 Unspecified mononeuropathy of unspecified lower limb: Secondary | ICD-10-CM | POA: Diagnosis not present

## 2020-12-14 NOTE — Progress Notes (Signed)
  Subjective:  Patient ID: Catherine Marks, female    DOB: [birth year],  MRN: [id number]  Chief Complaint  Patient presents with  . Plantar Fasciitis    "they are a little better than last visit."    35 y.o. female presents with the above complaint.  Patient presents with follow-up of bilateral plantar fasciitis.  Patient states that the injection helped considerably.  She does not have Planter fasciitis type of pain however she does have some numbness tingling especially if she is doing a job which requires her to be on a [place] switch.  She states that she is not able to perform her job duties because of that.  However her job requires function as well.  She would like to know if there is a way around it with an FMLA to cover to keep her away from type of work.  She denies any other acute complaints.   Review of Systems: Negative except as noted in the HPI. Denies N/V/F/Ch.  Past Medical History:  Diagnosis Date  . Depression   . Medical history non-contributory     Current Outpatient Medications:  .  Acetaminophen-Codeine 300-30 MG tablet, Take 1 tablet by mouth every 6 (six) hours as needed., Disp: , Rfl:  .  ibuprofen (ADVIL) 400 MG tablet, Take 400 mg by mouth 3 (three) times daily as needed., Disp: , Rfl:  .  levocetirizine (XYZAL) 5 MG tablet, Take 1 tablet (5 mg total) by mouth every evening., Disp: 90 tablet, Rfl: 1  Social History   Tobacco Use  Smoking Status Never Smoker  Smokeless Tobacco Never Used    Allergies  Allergen Reactions  . Fish Allergy     Cod fish  . Milk-Related Compounds   . Other   . Poultry Meal     [place]   . Wheat Bran    Objective:  There were no vitals filed for this visit. There is no height or weight on file to calculate BMI. Constitutional Well developed. Well nourished.  Vascular Dorsalis pedis pulses palpable bilaterally. Posterior tibial pulses palpable bilaterally. Capillary refill normal to all digits.  No cyanosis or clubbing  noted. Pedal hair growth normal.  Neurologic Normal speech. Oriented to person, place, and time. Epicritic sensation to light touch grossly present bilaterally.  Dermatologic Nails well groomed and normal in appearance. No open wounds. No skin lesions.  Orthopedic: Normal joint ROM without pain or crepitus bilaterally. No visible deformities. Tender to palpation at the calcaneal tuber bilaterally. No pain with calcaneal squeeze bilaterally. Ankle ROM diminished range of motion bilaterally. Silfverskiold Test: positive bilaterally.   Radiographs: Taken and reviewed. No acute fractures or dislocations. No evidence of stress fracture.  Plantar heel spur present. Posterior heel spur present.   Assessment:   1. Nerve entrapment syndrome of heel, unspecified laterality    Plan:  Patient was evaluated and treated and all questions answered.  [place] neuropathy -I explained to the patient the etiology of neuropathy and various treatment options were discussed.  Apparently this neuropathy is being aggravated by her 1 function in her job.  I believe patient will benefit from an FMLA to come to take her out of that one function for the job to give her relief.  Patient states understanding  Plantar Fasciitis, bilaterally~recurrence -Clinically resolving.  I have asked her the importance of maintaining shoe gear modification. -Continue wearing orthotics    No follow-ups on file.

## 2020-12-22 ENCOUNTER — Telehealth: Payer: Self-pay | Admitting: *Deleted

## 2020-12-22 NOTE — Telephone Encounter (Signed)
I attempted to call the patient to get clarification about her intermittent FMLA.  I left her a message for a return call on Monday.

## 2020-12-27 ENCOUNTER — Telehealth: Payer: BC Managed Care – PPO | Admitting: Family

## 2020-12-27 DIAGNOSIS — U071 COVID-19: Secondary | ICD-10-CM

## 2020-12-28 MED ORDER — FLUTICASONE PROPIONATE 50 MCG/ACT NA SUSP: 2.0000 | Freq: Every day | NASAL | 6 refills | Status: DC

## 2020-12-28 MED ORDER — BENZONATATE 100 MG PO CAPS: 100.0000 mg | ORAL_CAPSULE | Freq: Three times a day (TID) | ORAL | 0 refills | Status: DC | PRN

## 2020-12-28 MED ORDER — PREDNISONE 10 MG (21) PO TBPK: ORAL_TABLET | ORAL | 0 refills | Status: DC

## 2020-12-28 NOTE — Progress Notes (Signed)
E-Visit for Positive Covid Test Result We are sorry you are not feeling well. We are here to help!  You have tested positive for COVID-19, meaning that you were infected with the novel coronavirus and could give the virus to others.  It is vitally important that you stay home so you do not spread it to others.      Please continue isolation at home, for at least 10 days since the start of your symptoms and until you have had 24 hours with no fever (without taking a fever reducer) and with improving of symptoms.  If you have no symptoms but tested positive (or all symptoms resolve after 5 days and you have no fever) you can leave your house but continue to wear a mask around others for an additional 5 days. If you have a fever,continue to stay home until you have had 24 hours of no fever. Most cases improve 5-10 days from onset but we have seen a small number of patients who have gotten worse after the 10 days.  Please be sure to watch for worsening symptoms and remain taking the proper precautions.   Go to the nearest hospital ED for assessment if fever/cough/breathlessness are severe or illness seems like a threat to life.    The following symptoms may appear 2-14 days after exposure: . Fever . Cough . Shortness of breath or difficulty breathing . Chills . Repeated shaking with chills . Muscle pain . Headache . Sore throat . New loss of taste or smell . Fatigue . Congestion or runny nose . Nausea or vomiting . Diarrhea  You have been enrolled in [facility] for COVID-19. Daily you will receive a questionnaire within the [facility] website. Our COVID-19 response team will be monitoring your responses daily.  You can use medication such as prescription cough medication called Tessalon Perles 100 mg. You may take 1-2 capsules every 8 hours as needed for cough and prescription for Fluticasone nasal spray 2 sprays in each nostril one time per day and prednisone dose pack.   You may  also take acetaminophen (Tylenol) as needed for fever.  HOME CARE: . Only take medications as instructed by your medical team. . Drink plenty of fluids and get plenty of rest. . A steam or ultrasonic humidifier can help if you have congestion.   GET HELP RIGHT AWAY IF YOU HAVE EMERGENCY WARNING SIGNS.  Call 911 or proceed to your closest emergency facility if: . You develop worsening high fever. . Trouble breathing . Bluish lips or face . Persistent pain or pressure in the chest . New confusion . Inability to wake or stay awake . You cough up blood. . Your symptoms become more severe . Inability to hold down food or fluids  This list is not all possible symptoms. Contact your medical provider for any symptoms that are severe or concerning to you.    Your e-visit answers were reviewed by a board certified advanced clinical practitioner to complete your personal care plan.  Depending on the condition, your plan could have included both over the counter or prescription medications.  If there is a problem please reply once you have received a response from your provider.  Your safety is important to [place].  If you have drug allergies check your prescription carefully.    You can use MyChart to ask questions about today's visit, request a non-urgent call back, or ask for a work or school excuse for 24 hours related to this  e-Visit. If it has been greater than 24 hours you will need to follow up with your provider, or enter a new e-Visit to address those concerns. You will get an e-mail in the next two days asking about your experience.  I hope that your e-visit has been valuable and will speed your recovery. Thank you for using e-visits.     Approximately 5 minutes was spent documenting and reviewing patient's chart.

## 2020-12-29 NOTE — Telephone Encounter (Signed)
I attempted to call her again.  I left her a message.  I asked her to call me back.

## 2021-01-03 DIAGNOSIS — M79676 Pain in unspecified toe(s): Secondary | ICD-10-CM

## 2021-01-03 NOTE — Telephone Encounter (Signed)
I have been trying to reach you.  I need to know what job functions that you cannot perform.  "I apologize.  I have been down with Covid.  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  "Yes, that would be great."  I faxed her FMLA paperwork to [employer].

## 2021-08-14 ENCOUNTER — Encounter: Payer: BC Managed Care – PPO | Admitting: Podiatry

## 2022-10-30 ENCOUNTER — Ambulatory Visit
Admission: RE | Admit: 2022-10-30 | Discharge: 2022-10-30 | Disposition: A | Discharge status: Home / Self Care | Payer: BC Managed Care – PPO | Source: Ambulatory Visit | Attending: Urgent Care | Admitting: Urgent Care

## 2022-10-30 VITALS — BP 137/74 | HR 92 | Temp 98.3°F | Resp 18

## 2022-10-30 DIAGNOSIS — Z1152 Encounter for screening for COVID-19: Secondary | ICD-10-CM | POA: Insufficient documentation

## 2022-10-30 DIAGNOSIS — R6889 Other general symptoms and signs: Secondary | ICD-10-CM | POA: Insufficient documentation

## 2022-10-30 MED ORDER — OSELTAMIVIR PHOSPHATE 75 MG PO CAPS: 75.0000 mg | ORAL_CAPSULE | Freq: Two times a day (BID) | ORAL | 0 refills | Status: DC

## 2022-10-30 NOTE — Discharge Instructions (Addendum)
You have been diagnosed with a viral upper respiratory infection based on your symptoms and exam. Viral illnesses cannot be treated with antibiotics - they are self limiting - and you should find your symptoms resolving within a few days. Get plenty of rest and non-caffeinated fluids. Watch for signs of dehydration including reduced urine output and dark colored urine.  We have performed a respiratory swab testing for COVID. I have prescribed Tamiflu, antiviral therapy for influenza A, based on a presumptive diagnosis of influenza.  Someone will contact you after results of your swab are available with instructions to continue or stop this medication.If the results of this testing are positive, someone will call you if you are eligible for any antiviral treatment.    We recommend you use over-the-counter medications for symptom control including acetaminophen (Tylenol), ibuprofen (Advil/Motrin) or naproxen (Aleve) for throat pain, fever, chills or body aches. You may combine use of acetaminophen and ibuprofen/naproxen if needed.  Some patients find an pain-relieving throat spray such as Chloraseptic to be effective.  Also recommend cold/cough medication containing a cough suppressant such as dextromethorphan, as needed.   Saline mist spray is helpful for removing excess mucus from your nose.  Room humidifiers are helpful to ease breathing at night. I recommend guaifenesin (Mucinex) with plenty of water throughout the day to help thin and loosen mucus secretions in your respiratory passages.   You might also find relief of nasal/sinus congestion symptoms by using a nasal decongestant such as fluticasone (Flonase ) or pseudoephedrine (Sudafed sinus).  You will need to obtain Sudafed from behind the pharmacist counter.  Speak to the pharmacist to verify that you are not duplicating medications with other over-the-counter formulations that you may be using.     

## 2022-10-30 NOTE — ED Triage Notes (Signed)
Patient presents to UC for body aches, sore throat, mucous build-up. States she had a temp of 100.1 temp. Taking benadryl, ibuprofen.

## 2022-10-30 NOTE — ED Provider Notes (Signed)
[facility]    CSN: [id number] Arrival date & time: 10/30/22  [id number]      History   Chief Complaint Chief Complaint  Patient presents with   Sore Throat    Body aches, mucus buildup, and mild fever. - Entered by patient    HPI Catherine Marks is a 37 y.o. female.    Sore Throat    Presents to urgent care with complaint of body aches, sore throat, "mucus buildup" x 1 day.  Endorses documented temperature of 100.1 F at home.  Using Benadryl and ibuprofen to manage symptoms.  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   Plantar fasciitis 02/02/2016    Past Surgical History:  Procedure Laterality Date   LAPAROSCOPY N/A 11/02/2012   Procedure: LAPAROSCOPY OPERATIVE WITH LYSIS OF ADHESIONS;;  Surgeon: Osborne Oman, MD;  Location: [facility];  Service: Gynecology;  Laterality: N/A;   NO PAST SURGERIES     SALPINGOOPHORECTOMY Left 11/02/2012   Procedure: SALPINGO OOPHORECTOMY;  Surgeon: Osborne Oman, MD;  Location: [facility];  Service: Gynecology;  Laterality: Left;  left  with enterolysis    OB History     Gravida  0   Para  0   Term  0   Preterm  0   AB  0   Living  0      SAB  0   IAB  0   Ectopic  0   Multiple  0   Live Births               Home Medications    Prior to Admission medications   Medication Sig Start Date End Date Taking? Authorizing Provider  Acetaminophen-Codeine 300-30 MG tablet Take 1 tablet by mouth every 6 (six) hours as needed. 08/01/20   [provider]  benzonatate (TESSALON PERLES) 100 MG capsule Take 1 capsule (100 mg total) by mouth 3 (three) times daily as needed. 12/28/20   Sharion Balloon, FNP  fluticasone  (FLONASE) 50 MCG/ACT nasal spray Place 2 sprays into both nostrils daily. 12/28/20   Evelina Dun A, FNP  ibuprofen (ADVIL) 400 MG tablet Take 400 mg by mouth 3 (three) times daily as needed. 06/19/20   [provider]  levocetirizine (XYZAL) 5 MG tablet Take 1 tablet (5 mg total) by mouth every evening. 10/15/20   Sharion Balloon, FNP  predniSONE (STERAPRED UNI-PAK 21 TAB) 10 MG (21) TBPK tablet Use as directed 12/28/20   Sharion Balloon, FNP    Family History Family History  Problem Relation Age of Onset   Heart disease Father    Diabetes Father    Early death Father    Heart attack Father    Hypertension Father    Drug abuse Mother    Early death Maternal Grandmother    Mental illness Maternal Grandmother    Drug abuse Maternal Grandfather    Alcohol abuse Maternal Grandfather    Arthritis Paternal Grandmother    ADD / ADHD Paternal Grandfather    Early death Paternal Grandfather     Social History Social History   Tobacco Use   Smoking status: Never   Smokeless tobacco: Never  Vaping  Use   Vaping Use: Some days  Substance Use Topics   Alcohol use: Yes    Comment: socially   Drug use: No     Allergies   Fish allergy, Grass pollen([id number] vern), Milk-related compounds, Other, Poultry meal, and Wheat   Review of Systems Review of Systems   Physical Exam Triage Vital Signs ED Triage Vitals [10/30/22 0911]  Enc Vitals Group     BP      Pulse      Resp      Temp      Temp src      SpO2      Weight      Height      Head Circumference      Peak Flow      Pain Score 2     Pain Loc      Pain Edu?      Excl. in [place]?    No data found.  Updated Vital Signs There were no vitals taken for this visit.  Visual Acuity Right Eye Distance:   Left Eye Distance:   Bilateral Distance:    Right Eye Near:   Left Eye Near:    Bilateral Near:     Physical Exam Vitals reviewed.  Constitutional:      Appearance: She is well-developed. She is  ill-appearing.  HENT:     Right Ear: Tympanic membrane normal.     Left Ear: Tympanic membrane normal.     Mouth/Throat:     Pharynx: No oropharyngeal exudate or posterior oropharyngeal erythema.     Tonsils: No tonsillar exudate.  Cardiovascular:     Rate and Rhythm: Normal rate and regular rhythm.     Heart sounds: Normal heart sounds.  Pulmonary:     Effort: Pulmonary effort is normal.     Breath sounds: Normal breath sounds.  Lymphadenopathy:     Cervical: Cervical adenopathy present.  Skin:    General: Skin is warm and dry.  Neurological:     General: No focal deficit present.     Mental Status: She is alert and oriented to person, place, and time.  Psychiatric:        Mood and Affect: Mood normal.        Behavior: Behavior normal.      UC Treatments / Results  Labs (all labs ordered are listed, but only abnormal results are displayed) Labs Reviewed - No data to display  EKG   Radiology No results found.  Procedures Procedures (including critical care time)  Medications Ordered in UC Medications - No data to display  Initial Impression / Assessment and Plan / UC Course  I have reviewed the triage vital signs and the nursing notes.  Pertinent labs & imaging results that were available during my care of the patient were reviewed by me and considered in my medical decision making (see chart for details).   Patient is afebrile here with recent tylenol. Satting well on room air. Overall is ill appearing, well hydrated, without respiratory distress. Pulmonary exam is unremarkable.  Lungs CTAB without wheezing, rhonchi, rales.  TMs are WNL bilaterally.  No pharyngeal erythema.  No peritonsillar exudates.  There is mild cervical adenopathy.  Symptoms are consistent with an acute viral process including influenza or COVID.  Given symptoms of 1 day, will treat presumptively for influenza A using Tamiflu whilst awaiting results of COVID swab.  Otherwise recommending  continued use of OTC medication for symptom control.  Patient acknowledges  understanding and agreement with this treatment plan.  Final Clinical Impressions(s) / UC Diagnoses   Final diagnoses:  None   Discharge Instructions   None    ED Prescriptions   None    PDMP not reviewed this encounter.   Rose Phi, [place] 10/30/22 [phone]

## 2022-10-31 LAB — SARS CORONAVIRUS 2 (TAT 6-24 HRS): SARS Coronavirus 2: NEGATIVE

## 2022-12-02 ENCOUNTER — Ambulatory Visit
Admission: RE | Admit: 2022-12-02 | Discharge: 2022-12-02 | Disposition: A | Discharge status: Home / Self Care | Payer: BC Managed Care – PPO | Source: Ambulatory Visit

## 2022-12-02 VITALS — BP 148/87 | HR 75 | Temp 98.2°F | Resp 16

## 2022-12-02 DIAGNOSIS — A084 Viral intestinal infection, unspecified: Secondary | ICD-10-CM | POA: Diagnosis not present

## 2022-12-02 MED ORDER — DIPHENOXYLATE-ATROPINE 2.5-0.025 MG PO TABS: 1.0000 | ORAL_TABLET | Freq: Four times a day (QID) | ORAL | 0 refills | Status: DC | PRN

## 2022-12-02 MED ORDER — ONDANSETRON 4 MG PO TBDP: 4.0000 mg | ORAL_TABLET | Freq: Three times a day (TID) | ORAL | 0 refills | Status: DC | PRN

## 2022-12-02 NOTE — Discharge Instructions (Signed)
Your symptoms are most likely caused by a virus, it will work its way out your system over the next few days  You can use zofran every 8 hours as needed for nausea, be mindful this medication may make you drowsy, take the first dose at home to see how it affects your body  You can use lomotil every 6 hours  to help with diarrhea, and be mindful over use of this medication may cause opposite effect constipation  You can use over-the-counter ibuprofen or Tylenol, which ever you have at home, to help manage fevers  Continue to promote hydration throughout the day by using electrolyte replacement solution such as Gatorade, body armor, Pedialyte, which ever you have at home  Try eating bland foods such as bread, rice, toast, fruit which are easier on the stomach to digest, avoid foods that are overly spicy, overly seasoned or greasy

## 2022-12-02 NOTE — ED Provider Notes (Signed)
[facility]    CSN: [id number] Arrival date & time: 12/02/22  1144      History   Chief Complaint Chief Complaint  Patient presents with   Diarrhea    Vomiting, dizzy and weak - Entered by patient   Emesis   Dizziness    HPI Catherine Marks is a 37 y.o. female.   Patient presents for eval ration of diarrhea beginning 3 days ago and vomiting beginning 2 days ago.  Last occurrence this morning with stool described as soft, improvement in that have been watery.  Last occurrence of vomiting this morning, emesis described as bile.  Difficulty tolerating food and liquids but has been able to tolerate 1 bowl of cereal and water.  No known sick contacts prior.  Additionally experiencing intermittent generalized headaches, dizziness and bodyaches.  Has attempted use of Pepto-Bismol and ibuprofen which has been minimally effective.  Denies dietary changes, recent travel, no other member of household or close contacts have similar symptoms.  Denies fevers or abdominal pain.    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   Plantar fasciitis 02/02/2016    Past Surgical History:  Procedure Laterality Date   LAPAROSCOPY N/A 11/02/2012   Procedure: LAPAROSCOPY OPERATIVE WITH LYSIS OF ADHESIONS;;  Surgeon: Tereso Newcomer, MD;  Location: [facility];  Service: Gynecology;  Laterality: N/A;   NO PAST SURGERIES     SALPINGOOPHORECTOMY Left 11/02/2012   Procedure: SALPINGO OOPHORECTOMY;  Surgeon: Tereso Newcomer, MD;  Location: [facility];  Service: Gynecology;  Laterality: Left;  left  with enterolysis    OB History     Gravida  0   Para  0   Term  0   Preterm  0   AB  0   Living  0      SAB  0    IAB  0   Ectopic  0   Multiple  0   Live Births               Home Medications    Prior to Admission medications   Medication Sig Start Date End Date Taking? Authorizing Provider  levonorgestrel (MIRENA) 20 MCG/DAY IUD 1 each by Intrauterine route once.   Yes [provider]  Acetaminophen-Codeine 300-30 MG tablet Take 1 tablet by mouth every 6 (six) hours as needed. 08/01/20   [provider]  benzonatate (TESSALON PERLES) 100 MG capsule Take 1 capsule (100 mg total) by mouth 3 (three) times daily as needed. 12/28/20   Junie Spencer, FNP  fluticasone (FLONASE) 50 MCG/ACT nasal spray Place 2 sprays into both nostrils daily. 12/28/20   Jannifer Rodney A, FNP  ibuprofen (ADVIL) 400 MG tablet Take 400 mg by mouth 3 (three) times daily as needed. 06/19/20   [provider]  levocetirizine (XYZAL) 5 MG tablet Take 1 tablet (5 mg total) by mouth every evening. 10/15/20   Junie Spencer, FNP  oseltamivir (TAMIFLU) 75 MG capsule Take 1 capsule (75 mg total) by mouth every 12 (twelve) hours. 10/30/22   Immordino, Jeannett Senior, FNP  predniSONE (STERAPRED UNI-PAK 21 TAB) 10 MG (21) TBPK tablet Use as directed 12/28/20   Junie Spencer, FNP  Family History Family History  Problem Relation Age of Onset   Heart disease Father    Diabetes Father    Early death Father    Heart attack Father    Hypertension Father    Drug abuse Mother    Early death Maternal Grandmother    Mental illness Maternal Grandmother    Drug abuse Maternal Grandfather    Alcohol abuse Maternal Grandfather    Arthritis Paternal Grandmother    ADD / ADHD Paternal Grandfather    Early death Paternal Grandfather     Social History Social History   Tobacco Use   Smoking status: Never   Smokeless tobacco: Never  Vaping Use   Vaping Use: Former  Substance Use Topics   Alcohol use: Yes    Comment: socially   Drug use: No     Allergies   Fish allergy, Grass pollen(k-o-r-t-swt vern),  Milk-related compounds, Other, Poultry meal, and Wheat   Review of Systems Review of Systems  Constitutional: Negative.   Respiratory: Negative.    Cardiovascular: Negative.   Gastrointestinal:  Positive for diarrhea and vomiting. Negative for abdominal distention, abdominal pain, anal bleeding, blood in stool, constipation, nausea and rectal pain.  Musculoskeletal: Negative.   Skin: Negative.   Neurological:  Positive for dizziness. Negative for tremors, seizures, syncope, facial asymmetry, speech difficulty, weakness, light-headedness, numbness and headaches.     Physical Exam Triage Vital Signs ED Triage Vitals  Enc Vitals Group     BP 12/02/22 1221 (!) 148/87     Pulse Rate 12/02/22 1221 75     Resp 12/02/22 1221 16     Temp 12/02/22 1221 98.2 F (36.8 C)     Temp Source 12/02/22 1221 Oral     SpO2 12/02/22 1221 98 %     Weight --      Height --      Head Circumference --      Peak Flow --      Pain Score 12/02/22 1220 0     Pain Loc --      Pain Edu? --      Excl. in GC? --    No data found.  Updated Vital Signs BP (!) 148/87 (BP Location: Left Wrist)   Pulse 75   Temp 98.2 F (36.8 C) (Oral)   Resp 16   SpO2 98%   Visual Acuity Right Eye Distance:   Left Eye Distance:   Bilateral Distance:    Right Eye Near:   Left Eye Near:    Bilateral Near:     Physical Exam Constitutional:      Appearance: Normal appearance.  Eyes:     Extraocular Movements: Extraocular movements intact.  Pulmonary:     Effort: Pulmonary effort is normal.  Abdominal:     General: Abdomen is flat. Bowel sounds are increased.     Palpations: Abdomen is soft.     Tenderness: There is abdominal tenderness in the epigastric area.  Neurological:     Mental Status: She is alert and oriented to person, place, and time. Mental status is at baseline.      UC Treatments / Results  Labs (all labs ordered are listed, but only abnormal results are displayed) Labs Reviewed - No  data to display  EKG   Radiology No results found.  Procedures Procedures (including critical care time)  Medications Ordered in UC Medications - No data to display  Initial Impression / Assessment and Plan / UC Course  I  have reviewed the triage vital signs and the nursing notes.  Pertinent labs & imaging results that were available during my care of the patient were reviewed by me and considered in my medical decision making (see chart for details).  Viral gastroenteritis  Vital signs stable patient is in no signs of distress nontoxic-appearing, increased bowel sounds and tenderness to the epigastric region present on exam, low suspicion for more serious organ involvement, discussed with patient, etiology is most likely viral, prescribe Zofran and Lomotil, advised increase fluid intake until symptoms have resolved with a bland diet as tolerated, may follow-up with his urgent care as needed if symptoms persist or worsen, work note given Final Clinical Impressions(s) / UC Diagnoses   Final diagnoses:  None   Discharge Instructions   None    ED Prescriptions   None    PDMP not reviewed this encounter.   Valinda Hoar, NP 12/02/22 1302

## 2022-12-02 NOTE — ED Triage Notes (Signed)
Pt states yesterday she developed diarrhea, vomiting, fatigue and dizziness. Today she was able to keep foods and liquid down. She vomited once this morning and no cases of diarrhea today.

## 2022-12-27 DIAGNOSIS — M5416 Radiculopathy, lumbar region: Secondary | ICD-10-CM | POA: Diagnosis not present

## 2022-12-30 ENCOUNTER — Ambulatory Visit: Payer: BC Managed Care – PPO

## 2022-12-31 ENCOUNTER — Other Ambulatory Visit: Payer: Self-pay

## 2022-12-31 ENCOUNTER — Emergency Department
Admission: EM | Admit: 2022-12-31 | Discharge: 2022-12-31 | Disposition: A | Discharge status: Home / Self Care | Payer: BC Managed Care – PPO | Attending: Emergency Medicine | Admitting: Emergency Medicine

## 2022-12-31 ENCOUNTER — Encounter: Payer: Self-pay | Admitting: Emergency Medicine

## 2022-12-31 DIAGNOSIS — M79671 Pain in right foot: Secondary | ICD-10-CM | POA: Diagnosis not present

## 2022-12-31 DIAGNOSIS — M722 Plantar fascial fibromatosis: Secondary | ICD-10-CM | POA: Insufficient documentation

## 2022-12-31 NOTE — ED Triage Notes (Signed)
Pt via POV from home. Pt was at work standing on some sort of machine and states that both of her feet went numb and she almost fell. Pt has a hx of plantar fascitis. Pt states they are no longer numb but they are painful. Pt is A&Ox4 and NAD

## 2022-12-31 NOTE — ED Provider Notes (Signed)
[facility] Provider Note    None    (approximate)   History   Foot Pain   HPI  Catherine Marks is a 37 y.o. female with a past medical history of morbid obesity, metabolic syndrome, plantar fasciitis who presents today for evaluation of bilateral foot pain.  Patient reports that she was at work today and on a machine that vibrates and she felt like her feet both went numb.  She reports that she stepped off of the machine and her feet went back to normal.  She reports that her feet feel normal now.  She was instructed to come for evaluation.  She has not had any falls or trauma.  She denies any wounds or skin color changes.  She reports that she feels her normal self currently.  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 --      Excl. in GC? --     Most recent vital signs: Vitals:   12/31/22 1349  BP: 137/85  Pulse: 100  Resp: 19  Temp: 98.4 F (36.9 C)  SpO2: 96%    Physical Exam Vitals and nursing note reviewed.  Constitutional:      General: Awake and alert. No acute distress.    Appearance: Normal appearance. The patient is obese.  HENT:     Head: Normocephalic and atraumatic.     Mouth: Mucous membranes are moist.  Eyes:     General: PERRL.  Normal EOMs        Right eye: No discharge.        Left eye: No discharge.     Conjunctiva/sclera: Conjunctivae normal.  Cardiovascular:     Rate and Rhythm: Normal rate and regular rhythm.     Pulses: Normal pulses.  Pulmonary:     Effort: Pulmonary effort is normal. No respiratory distress.     Breath sounds: Normal breath sounds.  Abdominal:     Abdomen is soft. There is no abdominal tenderness. No rebound or guarding. No distention. Musculoskeletal:        General: No swelling. Normal range of motion.     Cervical back: Normal range of motion and neck supple.  Left foot: Normal-appearing foot and ankle.  2+ pedal pulses.  Normal capillary refill.  No skin changes.  No tenderness to palpation to foot or ankle except for mild plantar tenderness.  No calcaneal tenderness.  Negative Thompson test. Right foot: Normal-appearing foot and ankle.  2+  pedal pulses.  Normal capillary refill.  No skin changes.  No tenderness to palpation to foot or ankle except for mild plantar tenderness.  No calcaneal tenderness.  Negative Thompson test. Skin:    General: Skin is warm and dry.     Capillary Refill: Capillary refill takes less than 2 seconds.     Findings: No rash.  Neurological:     Mental Status: The patient is awake and alert.      ED Results / Procedures / Treatments   Labs (all labs ordered are listed, but only abnormal results are displayed) Labs Reviewed - No data to display   EKG     RADIOLOGY     PROCEDURES:  Critical Care performed:   Procedures   MEDICATIONS ORDERED IN ED: Medications - No data to display   IMPRESSION / MDM / ASSESSMENT AND PLAN / ED COURSE  I reviewed the triage vital signs and the nursing notes.   Differential diagnosis includes, but is not limited to, plantar fasciitis, nerve irritation, less likely sprain or fracture.  Patient is awake and alert, hemodynamically stable and afebrile.  She is neurovascularly intact.  She has 2+  pedal pulses, normal capillary refill.  There is no evidence of infection.  She has no abnormalities noted to her foot.  Her paresthesias have resolved completely.  She denies history of diabetes.  I suspect nerve irritation from the vibrating machine, no unilateral symptoms to suggest TIA or CVA.  No weakness in her extremities.  Her symptoms have resolved entirely.  Recommend close outpatient follow-up and return precautions.  Patient or stands and agrees with plan.  Discharged in stable condition.   Patient's presentation is most consistent with acute complicated illness / injury requiring diagnostic workup.   FINAL CLINICAL IMPRESSION(S) / ED DIAGNOSES   Final diagnoses:  Plantar fasciitis of right foot  Plantar fasciitis of left foot     Rx / DC Orders   ED Discharge Orders     None        Note:  This document was prepared using Dragon voice recognition software and may include unintentional dictation errors.   Jackelyn Hoehn, PA-C 12/31/22 1823    Corena Herter, MD 12/31/22 2350

## 2022-12-31 NOTE — Discharge Instructions (Signed)
Continue to wear supportive footwear, and follow-up with podiatry as needed.  Please return for any new, worsening, or change in symptoms or other concerns.  It was a pleasure caring for you today.

## 2023-01-02 ENCOUNTER — Encounter: Payer: Self-pay | Admitting: Physician Assistant

## 2023-01-02 ENCOUNTER — Ambulatory Visit: Payer: BC Managed Care – PPO | Admitting: Physician Assistant

## 2023-01-02 DIAGNOSIS — Z713 Dietary counseling and surveillance: Secondary | ICD-10-CM

## 2023-01-02 DIAGNOSIS — M542 Cervicalgia: Secondary | ICD-10-CM

## 2023-01-02 MED ORDER — CYCLOBENZAPRINE HCL 5 MG PO TABS
5.0000 mg | ORAL_TABLET | Freq: Every evening | ORAL | 0 refills | Status: DC | PRN
Start: 2023-01-02 — End: 2023-02-04

## 2023-01-02 NOTE — Progress Notes (Signed)
I,Catherine  Marks,acting as a Neurosurgeon for [employer], PA-C.,have documented all relevant documentation on the behalf of Catherine Ferguson, PA-C,as directed by  Catherine Ferguson, PA-C while in the presence of Catherine Ferguson, PA-C.   New patient visit   Patient: Catherine Marks   DOB: 1986/05/11   36 y.o. Female  MRN: [id number] Visit Date: 01/02/2023  Today's healthcare provider: [name], PA-C   Cc. New patient, weight loss questions  Subjective    Catherine Marks is a 37 y.o. female who presents today as a new patient to establish care.  HPI   She has some questions today about weight loss: H/o dieting  -Atkins, keto, vegan, carb counting. In gym.  H/o phentermine 7 mo did lose weight 270 lbs; lost an additional 40 lbs after doubling dose Pt works in [employer], fairly active, lifts heavier weights. Injury in 2020, was out of work and gained more weight . Pt keeps a food diary, tries to avoid carbs, water, diet coke, coffee. Uses splenda as a sweetener or sugar free syrup. She does eat out at gas stations during work. Goes to yoga sporadically 1-2 times a week.  Neck pain  --Worsening x 2 weeks ago, stabbing pain base of skull. Pain with turning head side to side. Radiates down right shoulder sometimes. No injury.  -Gabapentin not helping --Pt has a spinal surgeon for lumbar pain; they have discussed this with them. Past Medical History:  Diagnosis Date   Allergy    Depression    Medical history non-contributory    Past Surgical History:  Procedure Laterality Date   LAPAROSCOPY N/A 11/02/2012   Procedure: LAPAROSCOPY OPERATIVE WITH LYSIS OF ADHESIONS;;  Surgeon: Tereso Newcomer, MD;  Location: [facility];  Service: Gynecology;  Laterality: N/A;   NO PAST SURGERIES     SALPINGOOPHORECTOMY Left 11/02/2012   Procedure: SALPINGO OOPHORECTOMY;  Surgeon: Tereso Newcomer, MD;  Location: [facility];  Service: Gynecology;  Laterality: Left;  left  with enterolysis   Family Status   Relation Name Status   Father  Deceased   Mother  Alive   MGM  Deceased   MGF  Deceased   PGM  Alive   PGF  Deceased   Family History  Problem Relation Age of Onset   Heart disease Father    Diabetes Father    Early death Father    Heart attack Father    Hypertension Father    Drug abuse Mother    Early death Maternal Grandmother    Mental illness Maternal Grandmother    Drug abuse Maternal Grandfather    Alcohol abuse Maternal Grandfather    Arthritis Paternal Grandmother    ADD / ADHD Paternal Grandfather    Early death Paternal Grandfather    Social History   Socioeconomic History   Marital status: Married    Spouse name: Not on file   Number of children: Not on file   Years of education: Not on file   Highest education level: Not on file  Occupational History   Not on file  Tobacco Use   Smoking status: Never   Smokeless tobacco: Never  Vaping Use   Vaping Use: Former  Substance and Sexual Activity   Alcohol use: Yes    Comment: socially   Drug use: No   Sexual activity: Yes    Birth control/protection: I.U.D.    Comment: mirena  Other Topics Concern   Not on file  Social History Narrative   Not  on file   Social Determinants of Health   Financial Resource Strain: Not on file  Food Insecurity: Not on file  Transportation Needs: Not on file  Physical Activity: Not on file  Stress: Not on file  Social Connections: Not on file   Outpatient Medications Prior to Visit  Medication Sig   gabapentin (NEURONTIN) 100 MG capsule Take 100 mg by mouth 3 (three) times daily.   levonorgestrel (MIRENA) 20 MCG/DAY IUD 1 each by Intrauterine route once.   [DISCONTINUED] Acetaminophen-Codeine 300-30 MG tablet Take 1 tablet by mouth every 6 (six) hours as needed.   [DISCONTINUED] benzonatate (TESSALON PERLES) 100 MG capsule Take 1 capsule (100 mg total) by mouth 3 (three) times daily as needed.   [DISCONTINUED] diphenoxylate-atropine (LOMOTIL) 2.5-0.025 MG tablet  Take 1 tablet by mouth 4 (four) times daily as needed for diarrhea or loose stools.   [DISCONTINUED] fluticasone (FLONASE) 50 MCG/ACT nasal spray Place 2 sprays into both nostrils daily.   [DISCONTINUED] ibuprofen (ADVIL) 400 MG tablet Take 400 mg by mouth 3 (three) times daily as needed.   [DISCONTINUED] levocetirizine (XYZAL) 5 MG tablet Take 1 tablet (5 mg total) by mouth every evening.   [DISCONTINUED] ondansetron (ZOFRAN-ODT) 4 MG disintegrating tablet Take 1 tablet (4 mg total) by mouth every 8 (eight) hours as needed for nausea or vomiting.   [DISCONTINUED] oseltamivir (TAMIFLU) 75 MG capsule Take 1 capsule (75 mg total) by mouth every 12 (twelve) hours.   [DISCONTINUED] predniSONE (STERAPRED UNI-PAK 21 TAB) 10 MG (21) TBPK tablet Use as directed   No facility-administered medications prior to visit.   Allergies  Allergen Reactions   Fish Allergy     Cod fish   Grass Pollen(K-O-R-T-Swt Vern) Other (See Comments)   Milk-Related Compounds    Other    Poultry Meal     [place]    Wheat     Immunization History  Administered Date(s) Administered   Influenza Split 11/03/2012, 05/12/2017   PFIZER(Purple Top)SARS-COV-2 Vaccination 11/07/2019, 11/30/2019    Health Maintenance  Topic Date Due   HIV Screening  Never done   Hepatitis C Screening  Never done   DTaP/Tdap/Td (1 - Tdap) Never done   PAP SMEAR-Modifier  09/22/2021   COVID-19 Vaccine (3 - 2023-24 season) 04/12/2022   INFLUENZA VACCINE  03/13/2023   HPV VACCINES  Aged Out    Patient Care Team: Catherine Ferguson, PA-C as PCP - General (Physician Assistant)  Review of Systems  HENT:  Positive for sinus pressure.   Eyes:  Positive for itching.  Allergic/Immunologic: Positive for environmental allergies and food allergies.  Neurological:  Positive for headaches.  All other systems reviewed and are negative.   Objective    BP 125/78 (BP Location: Left Arm, Patient Position: Sitting, Cuff Size: Large)   Pulse 77    Temp 98.5 F (36.9 C) (Oral)   Resp 13   Ht 5\' 4"  (1.626 m)   Wt (!) 347 lb (157.4 kg)   SpO2 99%   BMI 59.56 kg/m   Physical Exam Constitutional:      General: She is awake.     Appearance: She is well-developed.  HENT:     Head: Normocephalic.  Eyes:     Conjunctiva/sclera: Conjunctivae normal.  Neck:     Comments: Tension entire neck and Rt trap Cardiovascular:     Rate and Rhythm: Normal rate and regular rhythm.     Heart sounds: Normal heart sounds.  Pulmonary:     Effort: Pulmonary  effort is normal.     Breath sounds: Normal breath sounds.  Skin:    General: Skin is warm.  Neurological:     Mental Status: She is alert and oriented to person, place, and time.  Psychiatric:        Attention and Perception: Attention normal.        Mood and Affect: Mood normal.        Speech: Speech normal.        Behavior: Behavior is cooperative.    Depression Screen    01/02/2023    2:16 PM  PHQ 2/9 Scores  PHQ - 2 Score 0  PHQ- 9 Score 3   No results found for any visits on 01/02/23.  Assessment & Plan      Problem List Items Addressed This Visit       Other   Morbid obesity (HCC) - Primary    Discussed diet, exercise length Discussed medication, phentermine, GLPs, advised pt to discuss GLPs for weight loss w/ her insurance for coverage.        Relevant Orders   CBC w/Diff/Platelet   Comprehensive Metabolic Panel (CMET)   Lipid Profile   HgB A1c   TSH + free T4   Weight loss counseling, encounter for   Other Visit Diagnoses     Neck pain       Relevant Medications   cyclobenzaprine (FLEXERIL) 5 MG tablet      3. Neck pain Heat, massage, flexeril  - cyclobenzaprine (FLEXERIL) 5 MG tablet; Take 1 tablet (5 mg total) by mouth at bedtime as needed for muscle spasms.  Dispense: 30 tablet; Refill: 0  Reminder due for pap smear this year.  Return if symptoms worsen or fail to improve.     I, Catherine Ferguson, PA-C have reviewed all documentation for  this visit. The documentation on  01/02/23   for the exam, diagnosis, procedures, and orders are all accurate and complete.  Catherine Ferguson, PA-C [facility] [address] [place], [place], [address] Office: [phone] Fax: [phone]   [facility]

## 2023-01-02 NOTE — Assessment & Plan Note (Signed)
Discussed diet, exercise length Discussed medication, phentermine, GLPs, advised pt to discuss GLPs for weight loss w/ her insurance for coverage.

## 2023-01-02 NOTE — Patient Instructions (Signed)
Wegovy, Zepbound-- once a week injection  Saxenda -- daily injection  Ozempic and Mounjaro are only for diabetes.

## 2023-01-03 ENCOUNTER — Other Ambulatory Visit: Payer: Self-pay | Admitting: Physician Assistant

## 2023-01-03 ENCOUNTER — Telehealth: Payer: Self-pay | Admitting: Physician Assistant

## 2023-01-03 DIAGNOSIS — E1165 Type 2 diabetes mellitus with hyperglycemia: Secondary | ICD-10-CM

## 2023-01-03 LAB — LIPID PANEL
Chol/HDL Ratio: 6.8 ratio — ABNORMAL HIGH (ref 0.0–4.4)
Cholesterol, Total: 196 mg/dL (ref 100–199)
HDL: 29 mg/dL — ABNORMAL LOW (ref >39)
LDL Chol Calc (NIH): 124 mg/dL — ABNORMAL HIGH (ref 0–99)
Triglycerides: 242 mg/dL — ABNORMAL HIGH (ref 0–149)
VLDL Cholesterol Cal: 43 mg/dL — ABNORMAL HIGH (ref 5–40)

## 2023-01-03 LAB — COMPREHENSIVE METABOLIC PANEL
ALT: 53 IU/L — ABNORMAL HIGH (ref 0–32)
AST: 53 IU/L — ABNORMAL HIGH (ref 0–40)
Albumin/Globulin Ratio: 1.4 (ref 1.2–2.2)
Albumin: 4 g/dL (ref 3.9–4.9)
Alkaline Phosphatase: 139 IU/L — ABNORMAL HIGH (ref 44–121)
BUN/Creatinine Ratio: 18 (ref 9–23)
BUN: 11 mg/dL (ref 6–20)
Bilirubin Total: 0.2 mg/dL (ref 0.0–1.2)
CO2: 25 mmol/L (ref 20–29)
Calcium: 9.6 mg/dL (ref 8.7–10.2)
Chloride: 99 mmol/L (ref 96–106)
Creatinine, Ser: 0.6 mg/dL (ref 0.57–1.00)
Globulin, Total: 2.9 g/dL (ref 1.5–4.5)
Glucose: 176 mg/dL — ABNORMAL HIGH (ref 70–99)
Potassium: 4.6 mmol/L (ref 3.5–5.2)
Sodium: 136 mmol/L (ref 134–144)
Total Protein: 6.9 g/dL (ref 6.0–8.5)
eGFR: 120 mL/min/{1.73_m2} (ref >59)

## 2023-01-03 LAB — CBC WITH DIFFERENTIAL/PLATELET
Basophils Absolute: 0.1 10*3/uL (ref 0.0–0.2)
Basos: 1 %
EOS (ABSOLUTE): 0.2 10*3/uL (ref 0.0–0.4)
Eos: 2 %
Hematocrit: 39.3 % (ref 34.0–46.6)
Hemoglobin: 12.5 g/dL (ref 11.1–15.9)
Immature Grans (Abs): 0.1 10*3/uL (ref 0.0–0.1)
Immature Granulocytes: 1 %
Lymphocytes Absolute: 3.9 10*3/uL — ABNORMAL HIGH (ref 0.7–3.1)
Lymphs: 34 %
MCH: 26.9 pg (ref 26.6–33.0)
MCHC: 31.8 g/dL (ref 31.5–35.7)
MCV: 85 fL (ref 79–97)
Monocytes Absolute: 0.7 10*3/uL (ref 0.1–0.9)
Monocytes: 6 %
Neutrophils Absolute: 6.6 10*3/uL (ref 1.4–7.0)
Neutrophils: 56 %
Platelets: 330 10*3/uL (ref 150–450)
RBC: 4.65 x10E6/uL (ref 3.77–5.28)
RDW: 13.8 % (ref 11.7–15.4)
WBC: 11.6 10*3/uL — ABNORMAL HIGH (ref 3.4–10.8)

## 2023-01-03 LAB — HEMOGLOBIN A1C
Est. average glucose Bld gHb Est-mCnc: 203 mg/dL
Hgb A1c MFr Bld: 8.7 % — ABNORMAL HIGH (ref 4.8–5.6)

## 2023-01-03 LAB — TSH+FREE T4
Free T4: 0.96 ng/dL (ref 0.82–1.77)
TSH: 2.63 u[IU]/mL (ref 0.450–4.500)

## 2023-01-03 MED ORDER — TIRZEPATIDE 2.5 MG/0.5ML ~~LOC~~ SOAJ
2.5000 mg | SUBCUTANEOUS | 0 refills | Status: DC
Start: 2023-01-03 — End: 2023-02-04

## 2023-01-03 NOTE — Telephone Encounter (Signed)
Started PA for [employer]. [id number] It says contact members plan for PA request If we could do this ASAP please

## 2023-01-09 NOTE — Telephone Encounter (Signed)
PA for mounjaro 2.5mg  was approved 11/03/3022 to 01/02/2024

## 2023-01-29 DIAGNOSIS — M5416 Radiculopathy, lumbar region: Secondary | ICD-10-CM | POA: Diagnosis not present

## 2023-01-29 DIAGNOSIS — E119 Type 2 diabetes mellitus without complications: Secondary | ICD-10-CM | POA: Diagnosis not present

## 2023-02-02 ENCOUNTER — Other Ambulatory Visit: Payer: Self-pay | Admitting: Physician Assistant

## 2023-02-02 DIAGNOSIS — M542 Cervicalgia: Secondary | ICD-10-CM

## 2023-02-02 DIAGNOSIS — E1165 Type 2 diabetes mellitus with hyperglycemia: Secondary | ICD-10-CM

## 2023-02-04 NOTE — Telephone Encounter (Signed)
Requested medications are due for refill today.  yes  Requested medications are on the active medications list.  yes  Last refill. 12/2022  Future visit scheduled.   yes  Notes to clinic.  Not delegated for cyclobenzaprine. Mounjaro not assigned to a protocol.    Requested Prescriptions  Pending Prescriptions Disp Refills   cyclobenzaprine (FLEXERIL) 5 MG tablet [Pharmacy Med Name: CYCLOBENZAPRINE 5 MG TABLET] 30 tablet 0    Sig: TAKE 1 TABLET BY MOUTH AT BEDTIME AS NEEDED FOR MUSCLE SPASMS.     Not Delegated - Analgesics:  Muscle Relaxants Failed - 02/02/2023  2:27 PM      Failed - This refill cannot be delegated      Passed - Valid encounter within last 6 months    Recent Outpatient Visits           1 month ago Morbid obesity [facility])   [facility] [name], PA-C       Future Appointments             In 2 months Simmons-Robinson, Tawanna Cooler, MD [facility]             MOUNJARO 2.5 MG/0.5ML Pen [Pharmacy Med Name: MOUNJARO 2.5 MG/0.5 ML PEN]      Sig: INJECT 2.5 MG SUBCUTANEOUSLY WEEKLY     Off-Protocol Failed - 02/02/2023  2:27 PM      Failed - Medication not assigned to a protocol, review manually.      Passed - Valid encounter within last 12 months    Recent Outpatient Visits           1 month ago Morbid obesity [facility])   [place] [facility] [name], PA-C       Future Appointments             In 2 months Simmons-Robinson, Tawanna Cooler, MD [facility], [facility]

## 2023-03-01 ENCOUNTER — Other Ambulatory Visit: Payer: Self-pay | Admitting: Family Medicine

## 2023-03-01 DIAGNOSIS — E1165 Type 2 diabetes mellitus with hyperglycemia: Secondary | ICD-10-CM

## 2023-03-20 ENCOUNTER — Encounter: Payer: Self-pay | Admitting: Family Medicine

## 2023-04-03 NOTE — Progress Notes (Signed)
Established patient visit   Patient: Catherine Marks   DOB: [birth year]   37 y.o. Female  MRN: [id number] Visit Date: 04/10/2023  Today's healthcare provider: Ronnald Ramp, MD   No chief complaint on file.  Subjective       Discussed the use of AI scribe software for clinical note transcription with the patient, who gave verbal consent to proceed.  History of Present Illness   The patient, with a history of hypertension, presented with concerns about elevated blood pressure readings, typically around 140/80. The patient expressed a desire to manage their blood pressure without medication, if possible, but was open to starting medication if necessary. She reported a relatively active lifestyle, walking up to six miles a day at work and aiming for 10,000 steps daily. However, she also noted that on their days off, they were less active.  Regarding their diet, the patient reported a recent shift towards whole grain carbohydrates and a reduction in sodium intake since their diabetes diagnosis in June. She described a typical day's meals, which included a banana, coffee, a multigrain bagel or sausage patty with scrambled eggs for breakfast, a chicken burrito bowl or grilled chicken breast with vegetables for lunch, and a meat dish with vegetables for dinner. She also mentioned occasional indulgences in sweets and alcohol.  The patient also reported a history of chronic plantar fasciitis due to their work environment, which involves standing on concrete floors. Despite using custom orthotics and high-quality footwear, they continued to experience discomfort. She had previously received monthly injections for this condition but had recently switched to a more holistic approach using cold-pressed castor oil.  In addition to these concerns, the patient reported persistent right-sided neck pain, which she attributed to their work involving frequent head turning and their habit of sleeping  on their stomach. She had previously been prescribed a muscle relaxer, which she found helpful, but were unsure if this should be a long-term solution.  Lastly, the patient mentioned a previous work injury in 2022, which resulted in a compressed nerve in their back. She had been prescribed gabapentin temporarily until they received an epidural shot. However, she were no longer taking this medication.      A1c 8.17 Dec 2022 Lab Results  Component Value Date   HGBA1C 6.5 (A) 04/10/2023      Medications: Outpatient Medications Prior to Visit  Medication Sig   levonorgestrel (MIRENA) 20 MCG/DAY IUD 1 each by Intrauterine route once.   [DISCONTINUED] cyclobenzaprine (FLEXERIL) 5 MG tablet TAKE 1 TABLET BY MOUTH AT BEDTIME AS NEEDED FOR MUSCLE SPASMS.   [DISCONTINUED] gabapentin (NEURONTIN) 100 MG capsule Take 100 mg by mouth 3 (three) times daily.   [DISCONTINUED] tirzepatide (MOUNJARO) 2.5 MG/0.5ML Pen INJECT 2.5 MG SUBCUTANEOUSLY WEEKLY   No facility-administered medications prior to visit.    Review of Systems      Objective    BP (!) 144/89 (BP Location: Left Arm, Patient Position: Sitting, Cuff Size: Large)   Pulse 71   Ht 5\' 4"  (1.626 m)   Wt (!) 331 lb (150.1 kg)   SpO2 100%   BMI 56.82 kg/m  BP Readings from Last 3 Encounters:  04/10/23 (!) 144/89  01/02/23 125/78  12/31/22 137/85   Wt Readings from Last 3 Encounters:  04/10/23 (!) 331 lb (150.1 kg)  01/02/23 (!) 347 lb (157.4 kg)  12/31/22 (!) 345 lb (156.5 kg)       Physical Exam Vitals reviewed.  Constitutional:  General: She is not in acute distress.    Appearance: Normal appearance. She is not ill-appearing, toxic-appearing or diaphoretic.  Eyes:     Conjunctiva/sclera: Conjunctivae normal.  Cardiovascular:     Rate and Rhythm: Normal rate and regular rhythm.     Pulses: Normal pulses.     Heart sounds: Normal heart sounds. No murmur heard.    No friction rub. No gallop.  Pulmonary:      Effort: Pulmonary effort is normal. No respiratory distress.     Breath sounds: Normal breath sounds. No stridor. No wheezing, rhonchi or rales.  Abdominal:     General: Bowel sounds are normal. There is no distension.     Palpations: Abdomen is soft.     Tenderness: There is no abdominal tenderness.  Musculoskeletal:     Right lower leg: No edema.     Left lower leg: No edema.     Comments: Right sided neck muscle spasm, normal ROM of neck, no swelling nor deformities noted   Skin:    Findings: No erythema or rash.  Neurological:     Mental Status: She is alert and oriented to person, place, and time.       Results for orders placed or performed in visit on 04/10/23  POCT HgB A1C  Result Value Ref Range   Hemoglobin A1C 6.5 (A) 4.0 - 5.6 %   HbA1c POC (<> result, manual entry)     HbA1c, POC (prediabetic range)     HbA1c, POC (controlled diabetic range)      Assessment & Plan     Problem List Items Addressed This Visit     Morbid obesity (HCC)    Chronic  BmI improved with weight loss  Continue mounjaro, increased dose to 5mg  once weekly  Counseled on importance of purposeful exercise and dietary modifications to assist with weight loss  Patient has lost significant weight recently (from 345 to 331 lbs) and is continuing to work on lifestyle modifications. -Encourage continuation of healthy diet and regular exercise.       Relevant Medications   tirzepatide (MOUNJARO) 5 MG/0.5ML Pen   Primary hypertension - Primary    Blood pressure consistently in the 140s/80s, with a goal of less than 130/80 to reduce risk of heart failure and stroke. Patient is not currently on any antihypertensive medications. -Start Amlodipine 5mg  daily. -Check blood pressure in 3 weeks to assess response to medication.      Relevant Medications   amLODipine (NORVASC) 5 MG tablet   Type 2 diabetes mellitus without complication, without long-term current use of insulin (HCC)    Recent A1C of  8.7, improved to 6.5 with lifestyle modifications -Continue Metformin 5mg  daily. -Follow up in 6 months to monitor A1C. -chronic,well controlled with A1c at goal of less than 7       Relevant Medications   tirzepatide (MOUNJARO) 5 MG/0.5ML Pen   Other Relevant Orders   POCT HgB A1C (Completed)   Urine microalbumin-creatinine with uACR   Other Visit Diagnoses     Neck pain, musculoskeletal       Relevant Medications   tizanidine (ZANAFLEX) 6 MG capsule        Neck Pain Right-sided neck pain, possibly related to work activities. Previous use of Gabapentin and Cyclobenzaprine with some relief. -Start Tizanidine 6mg  up to three times daily as needed for pain.   Follow-up -Return in 3 weeks to assess blood pressure control. -Return in 6 months to monitor diabetes control.  Return in about 3 weeks (around 05/01/2023) for neck pain, HTN .         Ronnald Ramp, MD  [facility] [phone] (phone) [phone] (fax)  [facility]

## 2023-04-10 ENCOUNTER — Encounter: Payer: Self-pay | Admitting: Family Medicine

## 2023-04-10 ENCOUNTER — Ambulatory Visit: Payer: BC Managed Care – PPO | Admitting: Family Medicine

## 2023-04-10 VITALS — BP 144/89 | HR 71 | Ht 64.0 in | Wt 331.0 lb

## 2023-04-10 DIAGNOSIS — Z7984 Long term (current) use of oral hypoglycemic drugs: Secondary | ICD-10-CM

## 2023-04-10 DIAGNOSIS — I10 Essential (primary) hypertension: Secondary | ICD-10-CM | POA: Diagnosis not present

## 2023-04-10 DIAGNOSIS — M542 Cervicalgia: Secondary | ICD-10-CM | POA: Diagnosis not present

## 2023-04-10 DIAGNOSIS — E1165 Type 2 diabetes mellitus with hyperglycemia: Secondary | ICD-10-CM

## 2023-04-10 DIAGNOSIS — I152 Hypertension secondary to endocrine disorders: Secondary | ICD-10-CM | POA: Insufficient documentation

## 2023-04-10 DIAGNOSIS — E119 Type 2 diabetes mellitus without complications: Secondary | ICD-10-CM | POA: Diagnosis not present

## 2023-04-10 LAB — POCT GLYCOSYLATED HEMOGLOBIN (HGB A1C): Hemoglobin A1C: 6.5 % — AB (ref 4.0–5.6)

## 2023-04-10 MED ORDER — TIZANIDINE HCL 6 MG PO CAPS: 6.0000 mg | ORAL_CAPSULE | Freq: Three times a day (TID) | ORAL | 1 refills | Status: DC | PRN

## 2023-04-10 MED ORDER — TIRZEPATIDE 5 MG/0.5ML ~~LOC~~ SOAJ
5.0000 mg | SUBCUTANEOUS | 2 refills | Status: DC
Start: 2023-04-10 — End: 2023-05-22

## 2023-04-10 MED ORDER — AMLODIPINE BESYLATE 5 MG PO TABS
5.0000 mg | ORAL_TABLET | Freq: Every day | ORAL | 1 refills | Status: DC
Start: 2023-04-10 — End: 2023-05-01

## 2023-04-10 NOTE — Assessment & Plan Note (Addendum)
Chronic  BmI improved with weight loss  Continue mounjaro, increased dose to 5mg  once weekly  Counseled on importance of purposeful exercise and dietary modifications to assist with weight loss  Patient has lost significant weight recently (from 345 to 331 lbs) and is continuing to work on lifestyle modifications. -Encourage continuation of healthy diet and regular exercise.

## 2023-04-10 NOTE — Assessment & Plan Note (Signed)
Recent A1C of 8.7, improved to 6.5 with lifestyle modifications -Continue Metformin 5mg  daily. -Follow up in 6 months to monitor A1C. -chronic,well controlled with A1c at goal of less than 7

## 2023-04-10 NOTE — Patient Instructions (Addendum)
Blood pressure was elevated today at 144/89 -Please start the amlodipine 5mg  once daily to help lower blood pressure  -Goal is 130/80 -please measure blood pressure 1 hour after taking this medication and keep a record of the measurements   Diabetes Mellitus and Nutrition, Adult When you have diabetes, or diabetes mellitus, it is very important to have healthy eating habits because your blood sugar (glucose) levels are greatly affected by what you eat and drink. Eating healthy foods in the right amounts, at about the same times every day, can help you: Manage your blood glucose. Lower your risk of heart disease. Improve your blood pressure. Reach or maintain a healthy weight. What can affect my meal plan? Every person with diabetes is different, and each person has different needs for a meal plan. Your health care provider may recommend that you work with a dietitian to make a meal plan that is best for you. Your meal plan may vary depending on factors such as: The calories you need. The medicines you take. Your weight. Your blood glucose, blood pressure, and cholesterol levels. Your activity level. Other health conditions you have, such as heart or kidney disease. How do carbohydrates affect me? Carbohydrates, also called carbs, affect your blood glucose level more than any other type of food. Eating carbs raises the amount of glucose in your blood. It is important to know how many carbs you can safely have in each meal. This is different for every person. Your dietitian can help you calculate how many carbs you should have at each meal and for each snack. How does alcohol affect me? Alcohol can cause a decrease in blood glucose (hypoglycemia), especially if you use insulin or take certain diabetes medicines by mouth. Hypoglycemia can be a life-threatening condition. Symptoms of hypoglycemia, such as sleepiness, dizziness, and confusion, are similar to symptoms of having too much alcohol. Do  not drink alcohol if: Your health care provider tells you not to drink. You are pregnant, may be pregnant, or are planning to become pregnant. If you drink alcohol: Limit how much you have to: 0-1 drink a day for women. 0-2 drinks a day for men. Know how much alcohol is in your drink. In the [place] one drink equals one 12 oz bottle of beer (355 mL), one 5 oz glass of wine (148 mL), or one 1 oz glass of hard liquor (44 mL). Keep yourself hydrated with water, diet soda, or unsweetened iced tea. Keep in mind that regular soda, juice, and other mixers may contain a lot of sugar and must be counted as carbs. What are tips for following this plan?  Reading food labels Start by checking the serving size on the Nutrition Facts label of packaged foods and drinks. The number of calories and the amount of carbs, fats, and other nutrients listed on the label are based on one serving of the item. Many items contain more than one serving per package. Check the total grams (g) of carbs in one serving. Check the number of grams of saturated fats and trans fats in one serving. Choose foods that have a low amount or none of these fats. Check the number of milligrams (mg) of salt (sodium) in one serving. Most people should limit total sodium intake to less than 2,300 mg per day. Always check the nutrition information of foods labeled as "low-fat" or "nonfat." These foods may be higher in added sugar or refined carbs and should be avoided. Talk to your dietitian to identify  your daily goals for nutrients listed on the label. Shopping Avoid buying canned, pre-made, or processed foods. These foods tend to be high in fat, sodium, and added sugar. Shop around the outside edge of the grocery store. This is where you will most often find fresh fruits and vegetables, bulk grains, fresh meats, and fresh dairy products. Cooking Use low-heat cooking methods, such as baking, instead of high-heat cooking methods, such as deep  frying. Cook using healthy oils, such as olive, canola, or sunflower oil. Avoid cooking with butter, cream, or high-fat meats. Meal planning Eat meals and snacks regularly, preferably at the same times every day. Avoid going long periods of time without eating. Eat foods that are high in fiber, such as fresh fruits, vegetables, beans, and whole grains. Eat 4-6 oz (112-168 g) of lean protein each day, such as lean meat, chicken, fish, eggs, or tofu. One ounce (oz) (28 g) of lean protein is equal to: 1 oz (28 g) of meat, chicken, or fish. 1 egg.  cup (62 g) of tofu. Eat some foods each day that contain healthy fats, such as avocado, nuts, seeds, and fish. What foods should I eat? Fruits Berries. Apples. Oranges. Peaches. Apricots. Plums. Grapes. Mangoes. Papayas. Pomegranates. Kiwi. Cherries. Vegetables Leafy greens, including lettuce, spinach, kale, chard, collard greens, mustard greens, and cabbage. Beets. Cauliflower. Broccoli. Carrots. Green beans. Tomatoes. Peppers. Onions. Cucumbers. Brussels sprouts. Grains Whole grains, such as whole-wheat or whole-grain bread, crackers, tortillas, cereal, and pasta. Unsweetened oatmeal. Quinoa. Brown or wild rice. Meats and other proteins Seafood. Poultry without skin. Lean cuts of poultry and beef. Tofu. Nuts. Seeds. Dairy Low-fat or fat-free dairy products such as milk, yogurt, and cheese. The items listed above may not be a complete list of foods and beverages you can eat and drink. Contact a dietitian for more information. What foods should I avoid? Fruits Fruits canned with syrup. Vegetables Canned vegetables. Frozen vegetables with butter or cream sauce. Grains Refined white flour and flour products such as bread, pasta, snack foods, and cereals. Avoid all processed foods. Meats and other proteins Fatty cuts of meat. Poultry with skin. Breaded or fried meats. Processed meat. Avoid saturated fats. Dairy Full-fat yogurt, cheese, or  milk. Beverages Sweetened drinks, such as soda or iced tea. The items listed above may not be a complete list of foods and beverages you should avoid. Contact a dietitian for more information. Questions to ask a health care provider Do I need to meet with a certified diabetes care and education specialist? Do I need to meet with a dietitian? What number can I call if I have questions? When are the best times to check my blood glucose? Where to find more information: American Diabetes Association: diabetes.org Academy of Nutrition and Dietetics: eatright.[employer] of Diabetes and Digestive and Kidney Diseases: StageSync.si Association of Diabetes Care & Education Specialists: diabeteseducator.org Summary It is important to have healthy eating habits because your blood sugar (glucose) levels are greatly affected by what you eat and drink. It is important to use alcohol carefully. A healthy meal plan will help you manage your blood glucose and lower your risk of heart disease. Your health care provider may recommend that you work with a dietitian to make a meal plan that is best for you. This information is not intended to replace advice given to you by your health care provider. Make sure you discuss any questions you have with your health care provider. Document Revised: 03/01/2020 Document Reviewed: 03/01/2020 Elsevier  Patient Education  2024 [employer].

## 2023-04-10 NOTE — Assessment & Plan Note (Signed)
Blood pressure consistently in the 140s/80s, with a goal of less than 130/80 to reduce risk of heart failure and stroke. Patient is not currently on any antihypertensive medications. -Start Amlodipine 5mg  daily. -Check blood pressure in 3 weeks to assess response to medication.

## 2023-04-11 LAB — MICROALBUMIN / CREATININE URINE RATIO
Creatinine, Urine: 91.7 mg/dL
Microalb/Creat Ratio: 10 mg/g{creat} (ref 0–29)
Microalbumin, Urine: 9.4 ug/mL

## 2023-04-22 ENCOUNTER — Ambulatory Visit: Payer: BC Managed Care – PPO | Admitting: Family Medicine

## 2023-05-01 ENCOUNTER — Encounter: Payer: Self-pay | Admitting: Family Medicine

## 2023-05-01 ENCOUNTER — Ambulatory Visit: Payer: BC Managed Care – PPO | Admitting: Family Medicine

## 2023-05-01 VITALS — BP 135/88 | HR 78 | Ht 64.0 in | Wt 331.0 lb

## 2023-05-01 DIAGNOSIS — I10 Essential (primary) hypertension: Secondary | ICD-10-CM | POA: Diagnosis not present

## 2023-05-01 DIAGNOSIS — M542 Cervicalgia: Secondary | ICD-10-CM

## 2023-05-01 DIAGNOSIS — E119 Type 2 diabetes mellitus without complications: Secondary | ICD-10-CM | POA: Diagnosis not present

## 2023-05-01 DIAGNOSIS — Z23 Encounter for immunization: Secondary | ICD-10-CM

## 2023-05-01 DIAGNOSIS — Z7985 Long-term (current) use of injectable non-insulin antidiabetic drugs: Secondary | ICD-10-CM

## 2023-05-01 MED ORDER — BACLOFEN 10 MG PO TABS
10.0000 mg | ORAL_TABLET | Freq: Three times a day (TID) | ORAL | 1 refills | Status: DC
Start: 2023-05-01 — End: 2023-06-02

## 2023-05-01 MED ORDER — AMLODIPINE-VALSARTAN-HCTZ 5-160-12.5 MG PO TABS: 1.0000 | ORAL_TABLET | Freq: Every day | ORAL | 2 refills | Status: DC

## 2023-05-01 NOTE — Progress Notes (Addendum)
Established patient visit   Patient: Catherine Marks   DOB: [birth year]   37 y.o. Female  MRN: [id number] Visit Date: 05/01/2023  Today's healthcare provider: Ronnald Ramp, MD   Chief Complaint  Patient presents with   Medical Management of Chronic Issues    3 week follow up on neck pain and HTN, muscle relaxer makes patient sleepy so its taken at night, so not as effective, would like to discuss taking all at the same time instead of once 3x daily, says very effective when taken    Subjective     HPI     Medical Management of Chronic Issues    Additional comments: 3 week follow up on neck pain and HTN, muscle relaxer makes patient sleepy so its taken at night, so not as effective, would like to discuss taking all at the same time instead of once 3x daily, says very effective when taken       Last edited by Rolly Salter, CMA on 05/01/2023  9:27 AM.       Discussed the use of AI scribe software for clinical note transcription with the patient, who gave verbal consent to proceed.  History of Present Illness   Catherine Marks, a patient with a history of hypertension and neck pain, presents with ongoing neck discomfort that has been present for at least six months. The pain has reportedly worsened since May. The patient describes the pain as stiffness, particularly when turning to the right, and it radiates down into the shoulder blade. The discomfort is primarily located in the neck, extending to the base of the skull.  The patient has been taking a muscle relaxer, Zenatane 6mg , at night which provides temporary relief. However, the medication causes drowsiness, preventing the patient from taking it during the day. Despite the medication, the patient reports waking up pain-free but the discomfort returns around midday.  The patient also reports a history of hypertension, currently managed with amlodipine 5mg . Despite medication, recent blood pressure readings have been  slightly elevated. The patient has not reported any adverse side effects from the amlodipine.  Regarding lifestyle, the patient has made dietary changes, reducing pizza intake and incorporating roasted wings into her diet. The patient has also noticed a slight decrease in weight.  The patient has not previously received physical therapy for the neck pain. However, she has had physical therapy for back pain in the past. The patient is open to trying new treatments to manage the neck discomfort.         Past Medical History:  Diagnosis Date   Allergy    Depression    Medical history non-contributory     Medications: Outpatient Medications Prior to Visit  Medication Sig   levonorgestrel (MIRENA) 20 MCG/DAY IUD 1 each by Intrauterine route once.   tirzepatide [facility]) 5 MG/0.5ML Pen Inject 5 mg into the skin once a week.   tizanidine (ZANAFLEX) 6 MG capsule Take 1 capsule (6 mg total) by mouth 3 (three) times daily as needed for muscle spasms.   [DISCONTINUED] amLODipine (NORVASC) 5 MG tablet Take 1 tablet (5 mg total) by mouth daily.   No facility-administered medications prior to visit.    Review of Systems      Objective    BP 135/88 (BP Location: Right Arm, Patient Position: Sitting, Cuff Size: Normal) Comment (BP Location): wrist  Pulse 78   Ht 5\' 4"  (1.626 m)   Wt (!) 331 lb (150.1 kg)  SpO2 99%   BMI 56.82 kg/m  BP Readings from Last 3 Encounters:  05/01/23 135/88  04/10/23 (!) 144/89  01/02/23 125/78   Wt Readings from Last 3 Encounters:  05/01/23 (!) 331 lb (150.1 kg)  04/10/23 (!) 331 lb (150.1 kg)  01/02/23 (!) 347 lb (157.4 kg)       Physical Exam Cardiovascular:     Rate and Rhythm: Normal rate and regular rhythm.  Pulmonary:     Effort: Pulmonary effort is normal. No respiratory distress.     Breath sounds: No stridor. No wheezing or rales.  Musculoskeletal:        General: Tenderness present. Normal range of motion.     Comments: Trapezius  muscle spasms on right >left  Neurological:     Mental Status: She is oriented to person, place, and time.       No results found for any visits on 05/01/23.  Assessment & Plan     Problem List Items Addressed This Visit     Primary hypertension    Chronic problem  Blood pressure slightly elevated at 135/88, despite adherence to Amlodipine 5mg . No adverse effects reported from current medication. -Discontinue Amlodipine 5mg . -Start Amlodipine/Valsartan/Hydrochlorothiazide (5mg /160mg /12.5mg ) daily. -Check blood pressure in 3 weeks.      Relevant Medications   amLODIPine-Valsartan-HCTZ 5-160-12.5 MG TABS   Type 2 diabetes mellitus without complication, without long-term current use of insulin (HCC)    -Continue Mounjaro 5mg  for diabetes management. No adverse effects reported.      Relevant Medications   amLODIPine-Valsartan-HCTZ 5-160-12.5 MG TABS   Other Visit Diagnoses     Neck pain, musculoskeletal    -  Primary   Relevant Medications   baclofen (LIORESAL) 10 MG tablet   Other Relevant Orders   Ambulatory referral to Physical Therapy   Flu vaccine need       Relevant Orders   Flu vaccine trivalent PF, 6mos and older(Flulaval,Afluria,Fluarix,Fluzone) (Completed)           Neck Pain Chronic pain for at least 6 months, worsened since May. Pain localized to the trapezius muscle, extending from the neck to the shoulder blade. Limited range of motion when turning head to the right. Current muscle relaxant (Zanaflex 6mg ) effective but causes drowsiness, limiting daytime use. -Discontinue Zanaflex. -Start Baclofen 10mg  three times a day. -Refer to physical therapy for targeted treatment.     General Health Maintenance -Administered influenza vaccine today.  -Follow-up in 3 weeks to assess blood pressure control and neck pain management.       Return in about 3 weeks (around 05/22/2023) for HTN.         Ronnald Ramp, MD  [facility] [phone] (phone) [phone] (fax)  [facility]

## 2023-05-02 NOTE — Assessment & Plan Note (Signed)
-  Continue Mounjaro 5mg  for diabetes management. No adverse effects reported.

## 2023-05-02 NOTE — Assessment & Plan Note (Signed)
Chronic problem  Blood pressure slightly elevated at 135/88, despite adherence to Amlodipine 5mg . No adverse effects reported from current medication. -Discontinue Amlodipine 5mg . -Start Amlodipine/Valsartan/Hydrochlorothiazide (5mg /160mg /12.5mg ) daily. -Check blood pressure in 3 weeks.

## 2023-05-22 ENCOUNTER — Encounter: Payer: Self-pay | Admitting: Family Medicine

## 2023-05-22 ENCOUNTER — Ambulatory Visit: Payer: BC Managed Care – PPO | Admitting: Family Medicine

## 2023-05-22 VITALS — BP 132/72 | HR 85 | Temp 97.9°F | Ht 64.0 in | Wt 323.3 lb

## 2023-05-22 DIAGNOSIS — I10 Essential (primary) hypertension: Secondary | ICD-10-CM

## 2023-05-22 DIAGNOSIS — Z7985 Long-term (current) use of injectable non-insulin antidiabetic drugs: Secondary | ICD-10-CM | POA: Diagnosis not present

## 2023-05-22 DIAGNOSIS — E119 Type 2 diabetes mellitus without complications: Secondary | ICD-10-CM | POA: Diagnosis not present

## 2023-05-22 DIAGNOSIS — M542 Cervicalgia: Secondary | ICD-10-CM | POA: Insufficient documentation

## 2023-05-22 MED ORDER — TIRZEPATIDE 7.5 MG/0.5ML ~~LOC~~ SOAJ
7.5000 mg | SUBCUTANEOUS | 1 refills | Status: DC
Start: 2023-05-22 — End: 2023-07-07

## 2023-05-22 MED ORDER — HYDROCHLOROTHIAZIDE 25 MG PO TABS: 12.5000 mg | ORAL_TABLET | Freq: Every day | ORAL | 3 refills | Status: DC

## 2023-05-22 MED ORDER — AMLODIPINE BESYLATE-VALSARTAN 5-160 MG PO TABS: 1.0000 | ORAL_TABLET | Freq: Every day | ORAL | 3 refills | Status: DC

## 2023-05-22 NOTE — Assessment & Plan Note (Signed)
Well controlled on Amlodipine/Valsartan/Hydrochlorothiazide 5/160/12.5mg . Discussed the cost of medication and potential to separate the combination pill into individual components if more affordable. Chronic BP at goal  -Continue current medication regimen. -Consider separating combination pill into individual components if more affordable. -prescribed valsartan-amlodipine 5/160mg  daily and Hctz 12.5mg  daily  -will check BMP at next appt in 4 months

## 2023-05-22 NOTE — Assessment & Plan Note (Signed)
Chronic  Improved with baclofen and regularly scheduled neck massages  Continue current regimen

## 2023-05-22 NOTE — Progress Notes (Signed)
Established patient visit   Patient: Catherine Marks   DOB: 28-Apr-1986   37 y.o. Female  MRN: [id number] Visit Date: 05/22/2023  Today's healthcare provider: Ronnald Ramp, MD   Chief Complaint  Patient presents with   Hypertension    HTN & mounjaro dose July it had no effects but August when it dose was uped it was fine but now again craving sugar and blood pressure medication needs to be discussed    Subjective     HPI     Hypertension    Additional comments: HTN & mounjaro dose July it had no effects but August when it dose was uped it was fine but now again craving sugar and blood pressure medication needs to be discussed       Last edited by Clois Comber on 05/22/2023  1:08 PM.       Discussed the use of AI scribe software for clinical note transcription with the patient, who gave verbal consent to proceed.  History of Present Illness   The patient, on a regimen of amlodipine, valsartan, hydrochlorothiazide, and Mounjaro, reports a resurgence of cravings for sweets and fried foods. She has been on the 5mg  dose of Mounjaro for approximately two months. The patient notes that the effectiveness of the medication seems to diminish after the first month of use, a pattern observed with both the current 5mg  dose and the previous 2.5mg  dose.  Despite these cravings, the patient has managed to maintain a downward trend in weight, from 331lbs in September to 323lbs at the time of the consultation. She has been making dietary adjustments, such as consuming chia pudding for breakfast, opting for whole wheat pasta, and incorporating lean meats and vegetables into meals. However, she struggles with late-night sugar cravings, likely exacerbated by the presence of sweets in the house.  The patient also mentions a history of rapid medication metabolism and expresses concern that this may be affecting the efficacy of the [facility]. She notes that coworkers on similar  medications have experienced more significant weight loss and are on higher doses.  Regarding other medications, the patient confirms adherence to her blood pressure regimen, which has resulted in satisfactory readings. She also reports that baclofen has been effective for her back pain. However, she has been unable to schedule physical therapy due to communication difficulties with the provider.  The patient's most recent A1c reading, taken at the end of August, was 6.5. She is due for another test in February. Her cholesterol levels are slightly elevated, with triglycerides at 242 and LDL at 124. The patient is aware of these readings and is attempting to manage them through lifestyle modifications.  Lastly, the patient mentions a history of plantar fasciitis and the use of various footwear to manage the condition. She also notes that she is due for an eye exam in January, with the previous exam showing no significant changes.      See [facility] for annual eye exams, has next appt in Jan 2025   Past Medical History:  Diagnosis Date   Allergy    Depression    Medical history non-contributory     Medications: Outpatient Medications Prior to Visit  Medication Sig   baclofen (LIORESAL) 10 MG tablet Take 1 tablet (10 mg total) by mouth 3 (three) times daily.   levonorgestrel (MIRENA) 20 MCG/DAY IUD 1 each by Intrauterine route once.   [DISCONTINUED] amLODIPine-Valsartan-HCTZ 5-160-12.5 MG TABS Take 1 tablet by mouth daily.   [  DISCONTINUED] tirzepatide [facility]) 5 MG/0.5ML Pen Inject 5 mg into the skin once a week.   tizanidine (ZANAFLEX) 6 MG capsule Take 1 capsule (6 mg total) by mouth 3 (three) times daily as needed for muscle spasms. (Patient not taking: Reported on 05/22/2023)   No facility-administered medications prior to visit.    Review of Systems  Last metabolic panel Lab Results  Component Value Date   GLUCOSE 176 (H) 01/02/2023   NA 136 01/02/2023   K 4.6 01/02/2023    CL 99 01/02/2023   CO2 25 01/02/2023   BUN 11 01/02/2023   CREATININE 0.60 01/02/2023   EGFR 120 01/02/2023   CALCIUM 9.6 01/02/2023   PROT 6.9 01/02/2023   ALBUMIN 4.0 01/02/2023   LABGLOB 2.9 01/02/2023   AGRATIO 1.4 01/02/2023   BILITOT 0.2 01/02/2023   ALKPHOS 139 (H) 01/02/2023   AST 53 (H) 01/02/2023   ALT 53 (H) 01/02/2023   Last lipids Lab Results  Component Value Date   CHOL 196 01/02/2023   HDL 29 (L) 01/02/2023   LDLCALC 124 (H) 01/02/2023   TRIG 242 (H) 01/02/2023   CHOLHDL 6.8 (H) 01/02/2023   Last hemoglobin A1c Lab Results  Component Value Date   HGBA1C 6.5 (A) 04/10/2023        Objective    BP 132/72 (BP Location: Right Arm, Patient Position: Sitting, Cuff Size: Large)   Pulse 85   Temp 97.9 F (36.6 C)   Ht 5\' 4"  (1.626 m)   Wt (!) 323 lb 4.8 oz (146.6 kg)   SpO2 100%   BMI 55.49 kg/m   BP Readings from Last 3 Encounters:  05/22/23 132/72  05/01/23 135/88  04/10/23 (!) 144/89   Wt Readings from Last 3 Encounters:  05/22/23 (!) 323 lb 4.8 oz (146.6 kg)  05/01/23 (!) 331 lb (150.1 kg)  04/10/23 (!) 331 lb (150.1 kg)       Physical Exam  General: female appearing stated age in no acute distress HEENT: Neck non-tender without lymphadenopathy, masses or thyromegaly Cardio: Normal S1 and S2, no S3 or S4. Rhythm is regular. No murmurs or rubs.  Bilateral radial pulses palpable Pulm: Clear to auscultation bilaterally, no crackles, wheezing, or diminished breath sounds. Normal respiratory effort Abdomen: Bowel sounds normal.  Extremities: No peripheral edema.  Feet: 6/6 sites normal monofilament testing, no skin breakdown, no ulcers, palpable PT and DP pulses bilaterally   No results found for any visits on 05/22/23.  Assessment & Plan     Problem List Items Addressed This Visit     Neck pain, musculoskeletal    Chronic  Improved with baclofen and regularly scheduled neck massages  Continue current regimen       Primary  hypertension - Primary    Well controlled on Amlodipine/Valsartan/Hydrochlorothiazide 5/160/12.5mg . Discussed the cost of medication and potential to separate the combination pill into individual components if more affordable. Chronic BP at goal  -Continue current medication regimen. -Consider separating combination pill into individual components if more affordable. -prescribed valsartan-amlodipine 5/160mg  daily and Hctz 12.5mg  daily  -will check BMP at next appt in 4 months       Relevant Medications   amLODipine-valsartan (EXFORGE) 5-160 MG tablet   hydrochlorothiazide (HYDRODIURIL) 25 MG tablet   Type 2 diabetes mellitus without complication, without long-term current use of insulin (HCC)    Patient reports increased cravings for sweets and fried foods while on Mounjaro 5mg . Weight loss progress noted (331lbs to 323lbs). Patient is maintaining a healthier  diet and is aware of late-night sugar cravings. Chronic -Increase Mounjaro to 7.5mg  weekly -last A1c at goal, will recheck at 4 month follow up in Feb 2025  -Continue current dietary habits and efforts towards weight loss.      Relevant Medications   tirzepatide (MOUNJARO) 7.5 MG/0.5ML Pen   amLODipine-valsartan (EXFORGE) 5-160 MG tablet          Hyperlipidemia Elevated triglycerides (242) and LDL (124). Currently managing with lifestyle modifications. Chronic, not within goal range, age less than 40 on no current statin  -Continue lifestyle modifications. -Recheck lipid panel in February 2025.   General Health Maintenance -Continue with planned eye exam in January 2025. -Continue with planned Pap smear in 2025. -Annual foot exam performed with normal results.         Return in about 5 months (around 10/06/2023) for DM, HTN.         Ronnald Ramp, MD  [facility] [phone] (phone) [phone] (fax)  [facility]

## 2023-05-22 NOTE — Assessment & Plan Note (Signed)
Patient reports increased cravings for sweets and fried foods while on Mounjaro 5mg . Weight loss progress noted (331lbs to 323lbs). Patient is maintaining a healthier diet and is aware of late-night sugar cravings. Chronic -Increase Mounjaro to 7.5mg  weekly -last A1c at goal, will recheck at 4 month follow up in Feb 2025  -Continue current dietary habits and efforts towards weight loss.

## 2023-06-01 ENCOUNTER — Other Ambulatory Visit: Payer: Self-pay | Admitting: Family Medicine

## 2023-06-01 DIAGNOSIS — M542 Cervicalgia: Secondary | ICD-10-CM

## 2023-06-02 NOTE — Telephone Encounter (Signed)
Requested by interface surescripts. Future visit in 4 months. Last OV note stated to continue treatment.  Requested Prescriptions  Pending Prescriptions Disp Refills   baclofen (LIORESAL) 10 MG tablet [Pharmacy Med Name: BACLOFEN 10 MG TABLET] 270 tablet 1    Sig: TAKE 1 TABLET BY MOUTH THREE TIMES A DAY     Analgesics:  Muscle Relaxants - baclofen Passed - 06/01/2023 10:32 AM      Passed - Cr in normal range and within 180 days    Creatinine, Ser  Date Value Ref Range Status  01/02/2023 0.60 0.57 - 1.00 mg/dL Final         Passed - eGFR is 30 or above and within 180 days    GFR calc Af Amer  Date Value Ref Range Status  11/03/2012 >90 >90 mL/min Final    Comment:           The eGFR has been calculated using the CKD EPI equation. This calculation has not been validated in all clinical situations. eGFR's persistently <90 mL/min signify possible Chronic Kidney Disease.   GFR calc non Af Amer  Date Value Ref Range Status  11/03/2012 >90 >90 mL/min Final   GFR  Date Value Ref Range Status  09/22/2018 118.37 >60.00 mL/min Final   eGFR  Date Value Ref Range Status  01/02/2023 120 >59 mL/min/1.73 Final         Passed - Valid encounter within last 6 months    Recent Outpatient Visits           1 week ago Primary hypertension   [place] [facility] Simmons-Robinson, Tawanna Cooler, MD   1 month ago Neck pain, musculoskeletal   [facility] Simmons-Robinson, [place], MD   1 month ago Primary hypertension   [place] [facility] [place], [place], MD   5 months ago Morbid obesity [facility])   [place] [facility] [name], PA-C       Future Appointments             In 4 months Simmons-Robinson, Tawanna Cooler, MD [facility] [place], [facility]

## 2023-06-24 ENCOUNTER — Other Ambulatory Visit (HOSPITAL_COMMUNITY)
Admission: RE | Admit: 2023-06-24 | Discharge: 2023-06-24 | Disposition: A | Discharge status: Home / Self Care | Payer: BC Managed Care – PPO | Source: Ambulatory Visit | Attending: Obstetrics and Gynecology | Admitting: Obstetrics and Gynecology

## 2023-06-24 ENCOUNTER — Ambulatory Visit: Payer: BC Managed Care – PPO | Admitting: Obstetrics and Gynecology

## 2023-06-24 ENCOUNTER — Encounter: Payer: Self-pay | Admitting: Obstetrics and Gynecology

## 2023-06-24 VITALS — BP 122/80 | HR 71 | Ht 64.0 in | Wt 316.0 lb

## 2023-06-24 DIAGNOSIS — T8332XA Displacement of intrauterine contraceptive device, initial encounter: Secondary | ICD-10-CM

## 2023-06-24 DIAGNOSIS — Z1339 Encounter for screening examination for other mental health and behavioral disorders: Secondary | ICD-10-CM

## 2023-06-24 DIAGNOSIS — Z01419 Encounter for gynecological examination (general) (routine) without abnormal findings: Secondary | ICD-10-CM | POA: Diagnosis not present

## 2023-06-24 DIAGNOSIS — N760 Acute vaginitis: Secondary | ICD-10-CM

## 2023-06-24 DIAGNOSIS — B9689 Other specified bacterial agents as the cause of diseases classified elsewhere: Secondary | ICD-10-CM

## 2023-06-24 DIAGNOSIS — Z30431 Encounter for routine checking of intrauterine contraceptive device: Secondary | ICD-10-CM | POA: Insufficient documentation

## 2023-06-24 DIAGNOSIS — Z1151 Encounter for screening for human papillomavirus (HPV): Secondary | ICD-10-CM

## 2023-06-24 DIAGNOSIS — B3731 Acute candidiasis of vulva and vagina: Secondary | ICD-10-CM

## 2023-06-24 DIAGNOSIS — B372 Candidiasis of skin and nail: Secondary | ICD-10-CM

## 2023-06-24 DIAGNOSIS — Z124 Encounter for screening for malignant neoplasm of cervix: Secondary | ICD-10-CM | POA: Insufficient documentation

## 2023-06-24 MED ORDER — METRONIDAZOLE 500 MG PO TABS: 500.0000 mg | ORAL_TABLET | Freq: Two times a day (BID) | ORAL | 0 refills | Status: DC

## 2023-06-24 MED ORDER — NYSTATIN 100000 UNIT/GM EX POWD: 1.0000 | Freq: Two times a day (BID) | CUTANEOUS | 1 refills | Status: AC

## 2023-06-24 MED ORDER — FLUCONAZOLE 150 MG PO TABS: 150.0000 mg | ORAL_TABLET | Freq: Once | ORAL | 0 refills | Status: AC

## 2023-06-24 NOTE — Progress Notes (Signed)
New GYN   LMP: None w/IUD Last pap:2020 WNL NO Hx of abnormal paps Contraception: Mirena  01/2018 Mammogram: N/A  Family Hx of Breast Cancer P.Aunt  STD Screening:Declines   CC: None

## 2023-06-24 NOTE — Progress Notes (Signed)
Obstetrics and Gynecology New Patient Evaluation  Appointment Date: 06/24/2023  OBGYN Clinic: [facility]   Primary Care Provider: Ronnald Ramp  Chief Complaint:  Chief Complaint  Patient presents with   Gynecologic Exam    History of Present Illness: Catherine Marks is a 37 y.o. Caucasian [id number] (No LMP recorded. (Menstrual status: IUD).), seen for the above chief complaint. Her past medical history is significant for BMI 50s, DM2, HTN, h/o LSO for large ovarian cyst (benign serous on surg path)  No complaints or issues today.  Mirena IUD in place since June 2019 and patient wondering about new one. She is using it for period and birth control. She is amenorrheic on it.   Review of Systems: Pertinent items are noted in HPI.   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   Plantar fasciitis 02/02/2016    Past Medical History:  Past Medical History:  Diagnosis Date   Allergy    Depression    Medical history non-contributory     Past Surgical History:  Past Surgical History:  Procedure Laterality Date   LAPAROSCOPY N/A 11/02/2012   Procedure: LAPAROSCOPY OPERATIVE WITH LYSIS OF ADHESIONS;;  Surgeon: Tereso Newcomer, MD;  Location: [facility];  Service: Gynecology;  Laterality: N/A;   SALPINGOOPHORECTOMY Left 11/02/2012   Procedure: SALPINGO OOPHORECTOMY;  Surgeon: Tereso Newcomer, MD;  Location: [facility];  Service: Gynecology;  Laterality: Left;  left  with enterolysis    Past Obstetrical History:  OB History  Gravida Para Term Preterm AB Living  0 0 0 0 0 0  SAB IAB Ectopic Multiple Live Births  0 0 0 0      Past Gynecological  History: As per HPI. History of Pap Smear(s): Yes.   Last pap 2020, which was negative.   Social History:  Social History   Socioeconomic History   Marital status: Married    Spouse name: Not on file   Number of children: Not on file   Years of education: Not on file   Highest education level: Some college, no degree  Occupational History   Not on file  Tobacco Use   Smoking status: Never   Smokeless tobacco: Never  Vaping Use   Vaping status: Former  Substance and Sexual Activity   Alcohol use: Yes    Comment: socially   Drug use: No   Sexual activity: Yes    Partners: Male    Birth control/protection: I.U.D.    Comment: mirena  Other Topics Concern   Not on file  Social History Narrative   Not on file   Social Determinants of Health   Financial Resource Strain: Low Risk  (05/22/2023)   Overall Financial Resource Strain (CARDIA)    Difficulty of Paying Living Expenses: Not very hard  Food Insecurity: No Food Insecurity (05/22/2023)   Hunger Vital Sign    Worried About Running Out of Food in the Last Year: Never true    Ran Out of Food in the Last Year: Never true  Recent Concern: Food Insecurity - Food Insecurity Present (04/06/2023)   Hunger Vital Sign    Worried About Running Out of Food in the Last Year: Sometimes true  Ran Out of Food in the Last Year: Never true  Transportation Needs: No Transportation Needs (05/22/2023)   PRAPARE - Administrator, Civil Service (Medical): No    Lack of Transportation (Non-Medical): No  Physical Activity: Sufficiently Active (05/22/2023)   Exercise Vital Sign    Days of Exercise per Week: 3 days    Minutes of Exercise per Session: 60 min  Stress: No Stress Concern Present (05/22/2023)   [employer] of Occupational Health - Occupational Stress Questionnaire    Feeling of Stress : Only a little  Recent Concern: Stress - Stress Concern Present (04/06/2023)   [employer] of Occupational Health -  Occupational Stress Questionnaire    Feeling of Stress : To some extent  Social Connections: Moderately Isolated (05/22/2023)   Social Connection and Isolation Panel [NHANES]    Frequency of Communication with Friends and Family: Twice a week    Frequency of Social Gatherings with Friends and Family: Once a week    Attends Religious Services: Never    Database administrator or Organizations: No    Attends Engineer, structural: Not on file    Marital Status: Married  Catering manager Violence: Not on file    Family History:  Family History  Problem Relation Age of Onset   Heart disease Father    Diabetes Father    Early death Father    Heart attack Father    Hypertension Father    Drug abuse Mother    Early death Maternal Grandmother    Mental illness Maternal Grandmother    Drug abuse Maternal Grandfather    Alcohol abuse Maternal Grandfather    Arthritis Paternal Grandmother    ADD / ADHD Paternal Grandfather    Early death Paternal Grandfather   Maternal aunt with breast cancer diagnosed in her mid 101s.   Medications Zayanna Yeckley had no medications administered during this visit. Current Outpatient Medications  Medication Sig Dispense Refill   amLODipine-valsartan (EXFORGE) 5-160 MG tablet Take 1 tablet by mouth daily. 90 tablet 3   baclofen (LIORESAL) 10 MG tablet TAKE 1 TABLET BY MOUTH THREE TIMES A DAY 270 tablet 1   hydrochlorothiazide (HYDRODIURIL) 25 MG tablet Take 0.5 tablets (12.5 mg total) by mouth daily. 90 tablet 3   levonorgestrel (MIRENA) 20 MCG/DAY IUD 1 each by Intrauterine route once.     tirzepatide (MOUNJARO) 7.5 MG/0.5ML Pen Inject 7.5 mg into the skin once a week. 6 mL 1   tizanidine (ZANAFLEX) 6 MG capsule Take 1 capsule (6 mg total) by mouth 3 (three) times daily as needed for muscle spasms. (Patient not taking: Reported on 05/22/2023) 90 capsule 1   No current facility-administered medications for this visit.    Allergies Fish allergy,  Grass pollen([id number] vern), Milk-related compounds, Other, Poultry meal, and Wheat   Physical Exam:  BP 122/80   Pulse 71   Ht 5\' 4"  (1.626 m)   Wt (!) 316 lb (143.3 kg)   BMI 54.24 kg/m  Body mass index is 54.24 kg/m. General appearance: Well nourished, well developed female in no acute distress.  Neck:  Supple, normal appearance, and no thyromegaly  Cardiovascular: normal s1 and s2.  No murmurs, rubs or gallops. Respiratory:  Clear to auscultation bilateral. Normal respiratory effort Abdomen: positive bowel sounds and no masses, hernias; diffusely non tender to palpation, non distended Breasts: breasts appear normal, no suspicious masses, no skin or nipple changes or axillary nodes, and normal palpation. Neuro/Psych:  Normal  mood and affect.  Skin:  Warm and dry.  Lymphatic:  No inguinal lymphadenopathy.   Pelvic exam: exam chaperoned by Catha Nottingham, CMA.  Pelvic exam: is not limited by body habitus EGBUS: with diffuse slight erythema up to mons c/w yeast Vagina: within normal limits with white d/c Cervix: normal appearing cervix without tenderness, discharge or lesions. IUD strings not seen Uterus:  nonenlarged and non tender Adnexa:  normal adnexa and no mass, fullness, tenderness Rectovaginal: deferred  Laboratory: none  Radiology: none  Assessment: patient stable   Plan:  1. Cervical cancer screening - Cytology - PAP  2. IUD check up I told her it's approved for 8 years for contraception and 5 years for period control but recommend leaving it in place since it's working well - Cytology - PAP - US PELVIS TRANSVAGINAL NON-OB (TV ONLY); Future  3. Intrauterine contraceptive device threads lost, initial encounter Not uncommon considering how long it's been in. Pt amenable to u/s - US PELVIS TRANSVAGINAL NON-OB (TV ONLY); Future  4. Candidal intertrigo D/w her that can use nystatin powder (sent in) if oral diflucan doesn't work and can use qwk if has recurrence  5.  BV (bacterial vaginosis) Flagyl and diflucan sent in  6. Vulvovaginal candidiasis  7. Well woman exam Declines STI testing   Orders Placed This Encounter  Procedures   US PELVIS TRANSVAGINAL NON-OB (TV ONLY)    RTC PRN  No follow-ups on file.  Future Appointments  Date Time Provider Department Center  10/07/2023  9:00 AM Simmons-Robinson, Tawanna Cooler, MD [facility]    Cornelia Copa MD Attending [facility])

## 2023-06-27 LAB — CYTOLOGY - PAP
Comment: NEGATIVE
Diagnosis: NEGATIVE
High risk HPV: NEGATIVE

## 2023-07-03 ENCOUNTER — Ambulatory Visit
Admission: RE | Admit: 2023-07-03 | Discharge: 2023-07-03 | Disposition: A | Discharge status: Home / Self Care | Payer: BC Managed Care – PPO | Source: Ambulatory Visit | Attending: Obstetrics and Gynecology | Admitting: Obstetrics and Gynecology

## 2023-07-03 DIAGNOSIS — Z30431 Encounter for routine checking of intrauterine contraceptive device: Secondary | ICD-10-CM | POA: Diagnosis not present

## 2023-07-03 DIAGNOSIS — N83291 Other ovarian cyst, right side: Secondary | ICD-10-CM | POA: Diagnosis not present

## 2023-07-03 DIAGNOSIS — D259 Leiomyoma of uterus, unspecified: Secondary | ICD-10-CM | POA: Diagnosis not present

## 2023-07-03 DIAGNOSIS — N8301 Follicular cyst of right ovary: Secondary | ICD-10-CM | POA: Diagnosis not present

## 2023-07-03 DIAGNOSIS — T8332XA Displacement of intrauterine contraceptive device, initial encounter: Secondary | ICD-10-CM | POA: Diagnosis not present

## 2023-07-03 DIAGNOSIS — N888 Other specified noninflammatory disorders of cervix uteri: Secondary | ICD-10-CM | POA: Diagnosis not present

## 2023-07-07 ENCOUNTER — Encounter: Payer: Self-pay | Admitting: Family Medicine

## 2023-07-07 DIAGNOSIS — E119 Type 2 diabetes mellitus without complications: Secondary | ICD-10-CM

## 2023-07-07 MED ORDER — TIRZEPATIDE 10 MG/0.5ML ~~LOC~~ SOAJ: 10.0000 mg | SUBCUTANEOUS | 2 refills | Status: DC

## 2023-07-11 NOTE — Addendum Note (Signed)
Addended by: [place] Bing on: 07/11/2023 11:21 AM   Modules accepted: Orders

## 2023-07-23 ENCOUNTER — Encounter: Payer: Self-pay | Admitting: Obstetrics and Gynecology

## 2023-07-31 ENCOUNTER — Encounter: Payer: Self-pay | Admitting: Family Medicine

## 2023-07-31 NOTE — Telephone Encounter (Signed)
 Care team updated and letter sent for eye exam notes.

## 2023-10-07 ENCOUNTER — Encounter: Payer: Self-pay | Admitting: Family Medicine

## 2023-10-07 ENCOUNTER — Ambulatory Visit: Payer: BC Managed Care – PPO | Admitting: Family Medicine

## 2023-10-07 VITALS — BP 103/68 | HR 80 | Ht 64.0 in | Wt 298.0 lb

## 2023-10-07 DIAGNOSIS — Z114 Encounter for screening for human immunodeficiency virus [HIV]: Secondary | ICD-10-CM | POA: Diagnosis not present

## 2023-10-07 DIAGNOSIS — E119 Type 2 diabetes mellitus without complications: Secondary | ICD-10-CM

## 2023-10-07 DIAGNOSIS — I10 Essential (primary) hypertension: Secondary | ICD-10-CM

## 2023-10-07 DIAGNOSIS — E1169 Type 2 diabetes mellitus with other specified complication: Secondary | ICD-10-CM

## 2023-10-07 DIAGNOSIS — Z6841 Body Mass Index (BMI) 40.0 and over, adult: Secondary | ICD-10-CM

## 2023-10-07 DIAGNOSIS — Z1159 Encounter for screening for other viral diseases: Secondary | ICD-10-CM | POA: Diagnosis not present

## 2023-10-07 DIAGNOSIS — Z7985 Long-term (current) use of injectable non-insulin antidiabetic drugs: Secondary | ICD-10-CM

## 2023-10-07 DIAGNOSIS — E785 Hyperlipidemia, unspecified: Secondary | ICD-10-CM

## 2023-10-07 NOTE — Assessment & Plan Note (Signed)
 Type 2 Diabetes Mellitus, managed with tirzepatide 10 mg weekly. Reports gastrointestinal side effects (eructation, constipation, and malaise) primarily on Saturdays after Friday doses. Despite these, prefers to continue 10 mg. Reports improved fit of clothes, though weight loss has plateaued. Increased physical activity with gym workouts and high activity level at work. Discussed shared decision making regarding dose adjustment; patient prefers to continue 10 mg. - Continue tirzepatide 10 mg weekly - Order hemoglobin A1c - Order complete metabolic panel - Order lipid panel - Request updated records from diabetes eye exam (January 2025) - Follow up in 4 months

## 2023-10-07 NOTE — Progress Notes (Signed)
 Established patient visit   Patient: Catherine Marks   DOB: [birth year]   38 y.o. Female  MRN: [id number] Visit Date: 10/07/2023  Today's healthcare provider: Ronnald Ramp, MD   Chief Complaint  Patient presents with   Hypertension    No concerns, 127/73 is the reading she is getting at home.    Health Maintenance    Eye exam done 1/25   Insurance wont cover routine Tdap    Subjective     HPI     Hypertension    Additional comments: No concerns, 127/73 is the reading she is getting at home.         Health Maintenance    Additional comments: Eye exam done 1/25   Insurance wont cover routine Tdap       Last edited by Thedora Hinders, CMA on 10/07/2023  9:13 AM.       Discussed the use of AI scribe software for clinical note transcription with the patient, who gave verbal consent to proceed.  History of Present Illness   Catherine Marks is a 38 year old female with diabetes and hypertension who presents for follow-up.  She manages her diabetes with tirzepatide 10 mg weekly. She experiences gastrointestinal side effects, particularly on Saturdays after taking the medication on Friday afternoons, including significant eructation and constipation. She feels ill if she consumes a large dinner on Friday night and has adjusted her eating habits to manage these side effects, opting for lighter meals like a banana instead of a bagel with cream cheese on Saturday mornings. Despite these adjustments, her weight loss has plateaued, although her clothes fit better, suggesting possible muscle gain. Her last hemoglobin A1c was well controlled, and she had a diabetes eye exam in January 2025.  Her hypertension is well-controlled with her current medication regimen, which includes amlodipine 5 mg, valsartan 160 mg, and hydrochlorothiazide 12.5 mg daily. Home blood pressure readings average around 127/73 mmHg. She reports feeling cold more often since starting blood pressure  medication.  She engages in regular physical activity, walking nearly 20,000 steps a day at work and attending the gym three times a week. Her gym routine includes both cardio and resistance training, focusing on full-body workouts on her days off. She has noticed improvements in her clothing size, having moved down to an extra-large in leggings and a smaller shirt size.      Past Medical History:  Diagnosis Date   Allergy    Depression    Medical history non-contributory     Medications: Outpatient Medications Prior to Visit  Medication Sig   amLODipine-valsartan (EXFORGE) 5-160 MG tablet Take 1 tablet by mouth daily.   baclofen (LIORESAL) 10 MG tablet TAKE 1 TABLET BY MOUTH THREE TIMES A DAY   hydrochlorothiazide (HYDRODIURIL) 25 MG tablet Take 0.5 tablets (12.5 mg total) by mouth daily.   levonorgestrel (MIRENA) 20 MCG/DAY IUD 1 each by Intrauterine route once.   tirzepatide (MOUNJARO) 10 MG/0.5ML Pen Inject 10 mg into the skin once a week.   [DISCONTINUED] metroNIDAZOLE (FLAGYL) 500 MG tablet Take 1 tablet (500 mg total) by mouth 2 (two) times daily.   [DISCONTINUED] tizanidine (ZANAFLEX) 6 MG capsule Take 1 capsule (6 mg total) by mouth 3 (three) times daily as needed for muscle spasms. (Patient not taking: Reported on 05/22/2023)   No facility-administered medications prior to visit.    Review of Systems  Last metabolic panel Lab Results  Component Value Date   GLUCOSE 176 (H)  01/02/2023   NA 136 01/02/2023   K 4.6 01/02/2023   CL 99 01/02/2023   CO2 25 01/02/2023   BUN 11 01/02/2023   CREATININE 0.60 01/02/2023   EGFR 120 01/02/2023   CALCIUM 9.6 01/02/2023   PROT 6.9 01/02/2023   ALBUMIN 4.0 01/02/2023   LABGLOB 2.9 01/02/2023   AGRATIO 1.4 01/02/2023   BILITOT 0.2 01/02/2023   ALKPHOS 139 (H) 01/02/2023   AST 53 (H) 01/02/2023   ALT 53 (H) 01/02/2023   Last lipids Lab Results  Component Value Date   CHOL 196 01/02/2023   HDL 29 (L) 01/02/2023   LDLCALC  124 (H) 01/02/2023   TRIG 242 (H) 01/02/2023   CHOLHDL 6.8 (H) 01/02/2023   Last hemoglobin A1c Lab Results  Component Value Date   HGBA1C 6.5 (A) 04/10/2023     See past labs  Heme  Chem  Endocrine  Serology  Results Review     Objective    BP 103/68 (BP Location: Right Arm, Patient Position: Sitting, Cuff Size: Large)   Pulse 80   Ht 5\' 4"  (1.626 m)   Wt 298 lb (135.2 kg)   SpO2 100%   BMI 51.15 kg/m  BP Readings from Last 3 Encounters:  10/07/23 103/68  06/24/23 122/80  05/22/23 132/72   Wt Readings from Last 3 Encounters:  10/07/23 298 lb (135.2 kg)  06/24/23 (!) 316 lb (143.3 kg)  05/22/23 (!) 323 lb 4.8 oz (146.6 kg)        Physical Exam Vitals reviewed.  Constitutional:      General: She is not in acute distress.    Appearance: Normal appearance. She is not ill-appearing, toxic-appearing or diaphoretic.  Eyes:     Conjunctiva/sclera: Conjunctivae normal.  Cardiovascular:     Rate and Rhythm: Normal rate and regular rhythm.     Pulses: Normal pulses.     Heart sounds: Normal heart sounds. No murmur heard.    No friction rub. No gallop.  Pulmonary:     Effort: Pulmonary effort is normal. No respiratory distress.     Breath sounds: Normal breath sounds. No stridor. No wheezing, rhonchi or rales.  Abdominal:     General: Bowel sounds are normal. There is no distension.     Palpations: Abdomen is soft.     Tenderness: There is no abdominal tenderness.  Musculoskeletal:     Right lower leg: No edema.     Left lower leg: No edema.  Skin:    Findings: No erythema or rash.  Neurological:     Mental Status: She is alert and oriented to person, place, and time.  Psychiatric:        Mood and Affect: Mood and affect normal.        Speech: Speech normal.        Behavior: Behavior normal. Behavior is cooperative.        No results found for any visits on 10/07/23.  Assessment & Plan     Problem List Items Addressed This Visit        Cardiovascular and Mediastinum   Primary hypertension   Chronic Hypertension, well-controlled with current regimen. Home readings average 127/73 mmHg. Today's reading is 103/68 mmHg. - Continue amlodipine 5 mg daily - Continue valsartan 160 mg daily - Continue hydrochlorothiazide 12.5 mg daily      Relevant Orders   Comprehensive metabolic panel     Endocrine   Type 2 diabetes mellitus without complication, without long-term current use of insulin (  HCC) - Primary   Type 2 Diabetes Mellitus, managed with tirzepatide 10 mg weekly. Reports gastrointestinal side effects (eructation, constipation, and malaise) primarily on Saturdays after Friday doses. Despite these, prefers to continue 10 mg. Reports improved fit of clothes, though weight loss has plateaued. Increased physical activity with gym workouts and high activity level at work. Discussed shared decision making regarding dose adjustment; patient prefers to continue 10 mg. - Continue tirzepatide 10 mg weekly - Order hemoglobin A1c - Order complete metabolic panel - Order lipid panel - Request updated records from diabetes eye exam (January 2025) - Follow up in 4 months      Relevant Orders   Hemoglobin A1c   Pneumococcal conjugate vaccine 20-valent (Prevnar 20) (Completed)     Other   Morbid obesity (HCC)   Other Visit Diagnoses       Screening for HIV (human immunodeficiency virus)       Relevant Orders   HIV Antibody (routine testing w rflx)     Encounter for hepatitis C screening test for low risk patient       Relevant Orders   Hepatitis C antibody     Hyperlipidemia associated with type 2 diabetes mellitus (HCC)       Relevant Orders   Lipid Profile          General Health Maintenance Declines tetanus vaccine due to insurance restrictions. Recommended pneumococcal vaccine due to diabetes and respiratory season. - Administer pneumococcal vaccine - Order HIV screening - Order hepatitis C screening   Return  in about 4 months (around 02/04/2024) for DM, HTN.      Ronnald Ramp, MD  [facility] [phone] (phone) [phone] (fax)  [facility]

## 2023-10-07 NOTE — Assessment & Plan Note (Signed)
 Chronic Hypertension, well-controlled with current regimen. Home readings average 127/73 mmHg. Today's reading is 103/68 mmHg. - Continue amlodipine 5 mg daily - Continue valsartan 160 mg daily - Continue hydrochlorothiazide 12.5 mg daily

## 2023-10-07 NOTE — Patient Instructions (Addendum)
 Recommended Vaccines:   Pneumoccocal vaccine  Updated Tetanus vaccine   Recommended Exam:  Diabetes Eye exam

## 2023-10-08 ENCOUNTER — Encounter: Payer: Self-pay | Admitting: Family Medicine

## 2023-10-09 LAB — LIPID PANEL
Chol/HDL Ratio: 5 ratio — ABNORMAL HIGH (ref 0.0–4.4)
Cholesterol, Total: 181 mg/dL (ref 100–199)
HDL: 36 mg/dL — ABNORMAL LOW (ref >39)
LDL Chol Calc (NIH): 127 mg/dL — ABNORMAL HIGH (ref 0–99)
Triglycerides: 99 mg/dL (ref 0–149)
VLDL Cholesterol Cal: 18 mg/dL (ref 5–40)

## 2023-10-09 LAB — COMPREHENSIVE METABOLIC PANEL
ALT: 22 IU/L (ref 0–32)
AST: 20 IU/L (ref 0–40)
Albumin: 4.2 g/dL (ref 3.9–4.9)
Alkaline Phosphatase: 128 IU/L — ABNORMAL HIGH (ref 44–121)
BUN/Creatinine Ratio: 23 (ref 9–23)
BUN: 16 mg/dL (ref 6–20)
Bilirubin Total: 0.5 mg/dL (ref 0.0–1.2)
CO2: 23 mmol/L (ref 20–29)
Calcium: 9.9 mg/dL (ref 8.7–10.2)
Chloride: 100 mmol/L (ref 96–106)
Creatinine, Ser: 0.71 mg/dL (ref 0.57–1.00)
Globulin, Total: 2.9 g/dL (ref 1.5–4.5)
Glucose: 87 mg/dL (ref 70–99)
Potassium: 4.3 mmol/L (ref 3.5–5.2)
Sodium: 140 mmol/L (ref 134–144)
Total Protein: 7.1 g/dL (ref 6.0–8.5)
eGFR: 113 mL/min/{1.73_m2} (ref >59)

## 2023-10-09 LAB — HIV ANTIBODY (ROUTINE TESTING W REFLEX)

## 2023-10-09 LAB — HEMOGLOBIN A1C
Est. average glucose Bld gHb Est-mCnc: 117 mg/dL
Hgb A1c MFr Bld: 5.7 % — ABNORMAL HIGH (ref 4.8–5.6)

## 2023-10-09 LAB — HEPATITIS C ANTIBODY: Hep C Virus Ab: NONREACTIVE

## 2023-10-27 ENCOUNTER — Ambulatory Visit
Admission: RE | Admit: 2023-10-27 | Discharge: 2023-10-27 | Disposition: A | Discharge status: Home / Self Care | Source: Ambulatory Visit | Attending: Family Medicine | Admitting: Family Medicine

## 2023-10-27 VITALS — BP 90/62 | HR 86 | Temp 98.7°F | Resp 16 | Ht 64.0 in | Wt 295.0 lb

## 2023-10-27 DIAGNOSIS — U071 COVID-19: Secondary | ICD-10-CM | POA: Insufficient documentation

## 2023-10-27 LAB — RESP PANEL BY RT-PCR (FLU A&B, COVID) ARPGX2
Influenza A by PCR: NEGATIVE
Influenza B by PCR: NEGATIVE
SARS Coronavirus 2 by RT PCR: POSITIVE — AB

## 2023-10-27 LAB — GROUP A STREP BY PCR: Group A Strep by PCR: NOT DETECTED

## 2023-10-27 MED ORDER — PAXLOVID (300/100) 20 X 150 MG & 10 X 100MG PO TBPK: 3.0000 | ORAL_TABLET | Freq: Two times a day (BID) | ORAL | 0 refills | Status: AC

## 2023-10-27 NOTE — ED Triage Notes (Signed)
 Pt c/o fever,chills,bodyaches & sore throat x2 days. Tmax 100.3 yesterday. Has tried mucinex w/o relief.

## 2023-10-27 NOTE — ED Provider Notes (Signed)
 [facility]    CSN: [id number] Arrival date & time: 10/27/23  0901      History   Chief Complaint Chief Complaint  Patient presents with   Fever    Appt   Generalized Body Aches   Sore Throat   Chills    HPI Catherine Marks is a 38 y.o. female.   HPI  History obtained from the patient. Catherine Marks presents for fever, body aches, rhinorrhea, nasal congestion, sore throat, chills that started on Sunday morning. Tmax 100.3 F. She went to work with a mask on but was not able to make it through the day.  She went home and slept the rest of the day.  Takes allergy medications daily. Took Mucinex cold and flu.  She vomited once on Sunday.  No diarrhea.  No known sick contacts.      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   Plantar fasciitis 02/02/2016    Past Surgical History:  Procedure Laterality Date   LAPAROSCOPY N/A 11/02/2012   Procedure: LAPAROSCOPY OPERATIVE WITH LYSIS OF ADHESIONS;;  Surgeon: Tereso Newcomer, MD;  Location: [facility];  Service: Gynecology;  Laterality: N/A;   SALPINGOOPHORECTOMY Left 11/02/2012   Procedure: SALPINGO OOPHORECTOMY;  Surgeon: Tereso Newcomer, MD;  Location: [facility];  Service: Gynecology;  Laterality: Left;  left  with enterolysis    OB History     Gravida  0   Para  0   Term  0   Preterm  0   AB  0   Living  0      SAB  0   IAB  0   Ectopic  0   Multiple  0   Live Births               Home Medications    Prior to Admission medications   Medication Sig Start Date End Date Taking? Authorizing  Provider  amLODipine-valsartan (EXFORGE) 5-160 MG tablet Take 1 tablet by mouth daily. 05/22/23  Yes Simmons-Robinson, Makiera, MD  baclofen (LIORESAL) 10 MG tablet TAKE 1 TABLET BY MOUTH THREE TIMES A DAY 06/02/23  Yes Simmons-Robinson, Makiera, MD  hydrochlorothiazide (HYDRODIURIL) 25 MG tablet Take 0.5 tablets (12.5 mg total) by mouth daily. 05/22/23  Yes Simmons-Robinson, Makiera, MD  levonorgestrel (MIRENA) 20 MCG/DAY IUD 1 each by Intrauterine route once.   Yes [provider]  nirmatrelvir/ritonavir (PAXLOVID, 300/100,) 20 x 150 MG & 10 x 100MG  TBPK Take 3 tablets by mouth 2 (two) times daily for 5 days. Patient GFR is 113. Take nirmatrelvir (150 mg) two tablets twice daily for 5 days and ritonavir (100 mg) one tablet twice daily for 5 days. 10/27/23 11/01/23 Yes Baili Stang, DO  tirzepatide [facility]) 10 MG/0.5ML Pen Inject 10 mg into the skin once a week. 07/07/23  Yes Simmons-Robinson, Tawanna Cooler, MD    Family History Family History  Problem Relation Age of Onset   Heart disease Father    Diabetes Father    Early death Father    Heart attack Father    Hypertension Father  Drug abuse Mother    Early death Maternal Grandmother    Mental illness Maternal Grandmother    Drug abuse Maternal Grandfather    Alcohol abuse Maternal Grandfather    Arthritis Paternal Grandmother    ADD / ADHD Paternal Grandfather    Early death Paternal Grandfather     Social History Social History   Tobacco Use   Smoking status: Never   Smokeless tobacco: Never  Vaping Use   Vaping status: Former  Substance Use Topics   Alcohol use: Yes    Comment: socially   Drug use: No     Allergies   Fish allergy, Grass pollen([id number]-swt vern), Milk-related compounds, Other, Poultry meal, and Wheat   Review of Systems Review of Systems: negative unless otherwise stated in HPI.      Physical Exam Triage Vital Signs ED Triage Vitals  Encounter Vitals Group     BP 10/27/23 0917 90/62      Systolic BP Percentile --      Diastolic BP Percentile --      Pulse Rate 10/27/23 0917 86     Resp 10/27/23 0917 16     Temp 10/27/23 0917 98.7 F (37.1 C)     Temp Source 10/27/23 0917 Oral     SpO2 10/27/23 0917 98 %     Weight 10/27/23 0916 295 lb (133.8 kg)     Height 10/27/23 0916 5\' 4"  (1.626 m)     Head Circumference --      Peak Flow --      Pain Score 10/27/23 0920 4     Pain Loc --      Pain Education --      Exclude from Growth Chart --    No data found.  Updated Vital Signs BP 90/62 (BP Location: Left Arm)   Pulse 86   Temp 98.7 F (37.1 C) (Oral)   Resp 16   Ht 5\' 4"  (1.626 m)   Wt 133.8 kg   SpO2 98%   BMI 50.64 kg/m   Visual Acuity Right Eye Distance:   Left Eye Distance:   Bilateral Distance:    Right Eye Near:   Left Eye Near:    Bilateral Near:     Physical Exam GEN:     alert, non-toxic appearing female in no distress    HENT:  mucus membranes moist, oropharyngeal without lesions or erythema, no tonsillar hypertrophy or exudates, clear nasal discharge, EYES:   no scleral injection or discharge NECK:  normal ROM,  no meningismus   RESP:  no increased work of breathing, clear to auscultation bilaterally CVS:   regular rate and rhythm Skin:   warm and dry    UC Treatments / Results  Labs (all labs ordered are listed, but only abnormal results are displayed) Labs Reviewed  RESP PANEL BY RT-PCR (FLU A&B, COVID) ARPGX2 - Abnormal; Notable for the following components:      Result Value   SARS Coronavirus 2 by RT PCR POSITIVE (*)    All other components within normal limits  GROUP A STREP BY PCR    EKG   Radiology No results found.  Procedures Procedures (including critical care time)  Medications Ordered in UC Medications - No data to display  Initial Impression / Assessment and Plan / UC Course  I have reviewed the triage vital signs and the nursing notes.  Pertinent labs & imaging results that were available during my  care of the patient were reviewed by  me and considered in my medical decision making (see chart for details).       Pt is a 38 y.o. female who presents for 1-2 days of respiratory symptoms. Catherine Marks is afebrile here. Satting well on room air. Overall pt is non-toxic appearing, well hydrated, without respiratory distress. Pulmonary exam is unremarkable.  COVID and influenza panel obtained and was COVID positive. After shared decision making, pt interested in Paxlovid. Reviewed serum creatine and recent GFR 113. Discussed symptomatic treatment.  Typical duration of symptoms discussed. Work note not needed.   Return and ED precautions given and voiced understanding. Discussed MDM, treatment plan and plan for follow-up with patient who agrees with plan.     Final Clinical Impressions(s) / UC Diagnoses   Final diagnoses:  COVID-19     Discharge Instructions      Strep and influenza tests are negative however you are COVID positive. Your test for COVID-19 was positive, meaning that you were infected with the novel coronavirus and could give the germ to others.  The recommendations suggest returning to normal activities when, for at least 24 hours, symptoms are improving overall, and if a fever was present, it has been gone without use of a fever-reducing medication.  You should wear a mask for the next 5 days to prevent the spread of disease. Please continue good preventive care measures, including:  frequent hand-washing, avoid touching your face, cover coughs/sneezes, stay out of crowds and keep a 6 foot distance from others.  Go to the nearest hospital emergency room if fever/cough/breathlessness are severe or illness seems like a threat to life.  If your were prescribed medication. Stop by the pharmacy to pick it up. You can take Tylenol and/or Ibuprofen as needed for fever reduction and pain relief.    For cough: honey 1/2 to 1 teaspoon (you can dilute the honey in water or another fluid).   You can also use guaifenesin and dextromethorphan for cough. You can use a humidifier for chest congestion and cough.  If you don't have a humidifier, you can sit in the bathroom with the hot shower running.      For sore throat: try warm salt water gargles, Mucinex sore throat cough drops or cepacol lozenges, throat spray, warm tea or water with lemon/honey, popsicles or ice, or OTC cold relief medicine for throat discomfort. You can also purchase chloraseptic spray at the pharmacy or dollar store.   For congestion: take a daily anti-histamine like Zyrtec, Claritin, and a oral decongestant, such as pseudoephedrine.  You can also use Flonase 1-2 sprays in each nostril daily. Afrin is also a good option, if you do not have high blood pressure.    It is important to stay hydrated: drink plenty of fluids (water, gatorade/powerade/pedialyte, juices, or teas) to keep your throat moisturized and help further relieve irritation/discomfort.    Return or go to the Emergency Department if symptoms worsen or do not improve in the next few days      ED Prescriptions     Medication Sig Dispense Auth. Provider   nirmatrelvir/ritonavir (PAXLOVID, 300/100,) 20 x 150 MG & 10 x 100MG  TBPK Take 3 tablets by mouth 2 (two) times daily for 5 days. Patient GFR is 113. Take nirmatrelvir (150 mg) two tablets twice daily for 5 days and ritonavir (100 mg) one tablet twice daily for 5 days. 30 tablet Katha Cabal, DO      PDMP not reviewed this encounter.   Katha Cabal, DO 10/27/23 1021

## 2023-10-27 NOTE — Discharge Instructions (Addendum)
 Strep and influenza tests are negative however you are COVID positive. Your test for COVID-19 was positive, meaning that you were infected with the novel coronavirus and could give the germ to others.  The recommendations suggest returning to normal activities when, for at least 24 hours, symptoms are improving overall, and if a fever was present, it has been gone without use of a fever-reducing medication.  You should wear a mask for the next 5 days to prevent the spread of disease. Please continue good preventive care measures, including:  frequent hand-washing, avoid touching your face, cover coughs/sneezes, stay out of crowds and keep a 6 foot distance from others.  Go to the nearest hospital emergency room if fever/cough/breathlessness are severe or illness seems like a threat to life.  If your were prescribed medication. Stop by the pharmacy to pick it up. You can take Tylenol and/or Ibuprofen as needed for fever reduction and pain relief.    For cough: honey 1/2 to 1 teaspoon (you can dilute the honey in water or another fluid).  You can also use guaifenesin and dextromethorphan for cough. You can use a humidifier for chest congestion and cough.  If you don't have a humidifier, you can sit in the bathroom with the hot shower running.      For sore throat: try warm salt water gargles, Mucinex sore throat cough drops or cepacol lozenges, throat spray, warm tea or water with lemon/honey, popsicles or ice, or OTC cold relief medicine for throat discomfort. You can also purchase chloraseptic spray at the pharmacy or dollar store.   For congestion: take a daily anti-histamine like Zyrtec, Claritin, and a oral decongestant, such as pseudoephedrine.  You can also use Flonase 1-2 sprays in each nostril daily. Afrin is also a good option, if you do not have high blood pressure.    It is important to stay hydrated: drink plenty of fluids (water, gatorade/powerade/pedialyte, juices, or teas) to keep your  throat moisturized and help further relieve irritation/discomfort.    Return or go to the Emergency Department if symptoms worsen or do not improve in the next few days

## 2023-11-25 ENCOUNTER — Encounter: Payer: Self-pay | Admitting: Family Medicine

## 2023-11-25 NOTE — Telephone Encounter (Signed)
Please see the pt message below

## 2023-11-26 ENCOUNTER — Other Ambulatory Visit: Payer: Self-pay | Admitting: Family Medicine

## 2023-11-26 DIAGNOSIS — E8881 Metabolic syndrome: Secondary | ICD-10-CM

## 2023-11-26 DIAGNOSIS — I10 Essential (primary) hypertension: Secondary | ICD-10-CM

## 2023-11-26 DIAGNOSIS — E119 Type 2 diabetes mellitus without complications: Secondary | ICD-10-CM

## 2023-11-26 DIAGNOSIS — Z6841 Body Mass Index (BMI) 40.0 and over, adult: Secondary | ICD-10-CM

## 2023-11-26 NOTE — Progress Notes (Signed)
  Patient requests to speak with bariatric surgery specialists due to plateau with weight loss on tirzepatide, BMI 50, hx of metabolic x syndrome   Referral for bariatric surgery placed

## 2023-11-28 ENCOUNTER — Other Ambulatory Visit: Payer: Self-pay | Admitting: Family Medicine

## 2023-11-28 DIAGNOSIS — M542 Cervicalgia: Secondary | ICD-10-CM

## 2023-11-28 NOTE — Telephone Encounter (Signed)
 Requested Prescriptions  Pending Prescriptions Disp Refills   baclofen  (LIORESAL ) 10 MG tablet [Pharmacy Med Name: BACLOFEN  10 MG TABLET] 90 tablet 5    Sig: TAKE 1 TABLET BY MOUTH THREE TIMES A DAY     Analgesics:  Muscle Relaxants - baclofen  Passed - 11/28/2023 12:45 PM      Passed - Cr in normal range and within 180 days    Creatinine, Ser  Date Value Ref Range Status  10/07/2023 0.71 0.57 - 1.00 mg/dL Final         Passed - eGFR is 30 or above and within 180 days    GFR calc Af Amer  Date Value Ref Range Status  11/03/2012 >90 >90 mL/min Final    Comment:           The eGFR has been calculated using the CKD EPI equation. This calculation has not been validated in all clinical situations. eGFR's persistently <90 mL/min signify possible Chronic Kidney Disease.   GFR calc non Af Amer  Date Value Ref Range Status  11/03/2012 >90 >90 mL/min Final   GFR  Date Value Ref Range Status  09/22/2018 118.37 >60.00 mL/min Final   eGFR  Date Value Ref Range Status  10/07/2023 113 >59 mL/min/1.73 Final         Passed - Valid encounter within last 6 months    Recent Outpatient Visits           1 month ago Type 2 diabetes mellitus without complication, without long-term current use of insulin (HCC)   [place] [facility] Simmons-Robinson, Rockie, MD       Future Appointments             In 2 months Simmons-Robinson, Rockie, MD [facility]

## 2023-12-22 ENCOUNTER — Telehealth: Payer: Self-pay

## 2023-12-22 ENCOUNTER — Other Ambulatory Visit (HOSPITAL_COMMUNITY): Payer: Self-pay

## 2023-12-22 NOTE — Telephone Encounter (Signed)
 Pharmacy Patient Advocate Encounter   Received notification from Patient Pharmacy that prior authorization for Mounjaro   is required/requested.   Insurance verification completed.   The patient is insured through [facility] .   Per test claim: Refill too soon. PA is not needed at this time. Medication was filled 12/22/23. Next eligible fill date is 01/07/24.

## 2023-12-30 DIAGNOSIS — Z1331 Encounter for screening for depression: Secondary | ICD-10-CM | POA: Diagnosis not present

## 2023-12-30 DIAGNOSIS — I10 Essential (primary) hypertension: Secondary | ICD-10-CM | POA: Diagnosis not present

## 2023-12-30 DIAGNOSIS — E119 Type 2 diabetes mellitus without complications: Secondary | ICD-10-CM | POA: Diagnosis not present

## 2023-12-30 DIAGNOSIS — E8881 Metabolic syndrome: Secondary | ICD-10-CM | POA: Diagnosis not present

## 2024-01-02 ENCOUNTER — Other Ambulatory Visit (HOSPITAL_COMMUNITY): Payer: Self-pay

## 2024-01-02 ENCOUNTER — Telehealth: Payer: Self-pay

## 2024-01-02 NOTE — Telephone Encounter (Signed)
 Pharmacy Patient Advocate Encounter   Received notification from RX Request Messages that prior authorization for Mounjaro  10 is required/requested. Patient current prior authorization expire 01/02/24.   Insurance verification completed.   The patient is insured through [facility] .   Per test claim: PA required; PA submitted to above mentioned insurance via CoverMyMeds Key/confirmation #/EOC [id number] Status is pending

## 2024-01-02 NOTE — Telephone Encounter (Signed)
 PA request has been Submitted. New Encounter has been or will be created for follow up. For additional info see Pharmacy Prior Auth telephone encounter from 01/02/24.

## 2024-01-02 NOTE — Telephone Encounter (Signed)
 Pharmacy Patient Advocate Encounter  Received notification from [employer] that Prior Authorization for Mounjaro  10 has been APPROVED from 01/02/24 to 12/31/24. Unable to obtain price due to refill too soon rejection, last fill date 12/22/23 next available fill date5/28/25   PA #/Case ID/Reference #: [id number]

## 2024-01-02 NOTE — Telephone Encounter (Signed)
 Please see the approved message below

## 2024-01-06 ENCOUNTER — Ambulatory Visit
Admission: RE | Admit: 2024-01-06 | Discharge: 2024-01-06 | Disposition: A | Discharge status: Home / Self Care | Payer: Worker's Compensation | Source: Ambulatory Visit | Attending: Physician Assistant | Admitting: Physician Assistant

## 2024-01-06 ENCOUNTER — Other Ambulatory Visit: Payer: Self-pay | Admitting: Physician Assistant

## 2024-01-06 DIAGNOSIS — Y990 Civilian activity done for income or pay: Secondary | ICD-10-CM | POA: Diagnosis not present

## 2024-01-06 DIAGNOSIS — M1711 Unilateral primary osteoarthritis, right knee: Secondary | ICD-10-CM | POA: Diagnosis not present

## 2024-01-06 DIAGNOSIS — S8991XA Unspecified injury of right lower leg, initial encounter: Secondary | ICD-10-CM | POA: Diagnosis not present

## 2024-01-06 DIAGNOSIS — M25561 Pain in right knee: Secondary | ICD-10-CM | POA: Insufficient documentation

## 2024-01-06 DIAGNOSIS — W3189XA Contact with other specified machinery, initial encounter: Secondary | ICD-10-CM | POA: Insufficient documentation

## 2024-01-08 DIAGNOSIS — F54 Psychological and behavioral factors associated with disorders or diseases classified elsewhere: Secondary | ICD-10-CM | POA: Diagnosis not present

## 2024-01-08 DIAGNOSIS — Z6841 Body Mass Index (BMI) 40.0 and over, adult: Secondary | ICD-10-CM | POA: Diagnosis not present

## 2024-02-02 DIAGNOSIS — E8881 Metabolic syndrome: Secondary | ICD-10-CM | POA: Diagnosis not present

## 2024-02-02 DIAGNOSIS — I10 Essential (primary) hypertension: Secondary | ICD-10-CM | POA: Diagnosis not present

## 2024-02-05 ENCOUNTER — Other Ambulatory Visit: Payer: Self-pay

## 2024-02-05 ENCOUNTER — Emergency Department
Admission: EM | Admit: 2024-02-05 | Discharge: 2024-02-05 | Disposition: A | Discharge status: Home / Self Care | Payer: Worker's Compensation | Attending: Emergency Medicine | Admitting: Emergency Medicine

## 2024-02-05 ENCOUNTER — Emergency Department: Payer: Worker's Compensation

## 2024-02-05 DIAGNOSIS — S61211A Laceration without foreign body of left index finger without damage to nail, initial encounter: Secondary | ICD-10-CM | POA: Diagnosis not present

## 2024-02-05 DIAGNOSIS — W228XXA Striking against or struck by other objects, initial encounter: Secondary | ICD-10-CM | POA: Insufficient documentation

## 2024-02-05 DIAGNOSIS — I10 Essential (primary) hypertension: Secondary | ICD-10-CM | POA: Diagnosis not present

## 2024-02-05 DIAGNOSIS — Z23 Encounter for immunization: Secondary | ICD-10-CM | POA: Insufficient documentation

## 2024-02-05 DIAGNOSIS — Y990 Civilian activity done for income or pay: Secondary | ICD-10-CM | POA: Diagnosis not present

## 2024-02-05 DIAGNOSIS — S6992XA Unspecified injury of left wrist, hand and finger(s), initial encounter: Secondary | ICD-10-CM | POA: Diagnosis present

## 2024-02-05 DIAGNOSIS — S61213A Laceration without foreign body of left middle finger without damage to nail, initial encounter: Secondary | ICD-10-CM | POA: Insufficient documentation

## 2024-02-05 DIAGNOSIS — E119 Type 2 diabetes mellitus without complications: Secondary | ICD-10-CM | POA: Diagnosis not present

## 2024-02-05 DIAGNOSIS — S61412A Laceration without foreign body of left hand, initial encounter: Secondary | ICD-10-CM | POA: Diagnosis not present

## 2024-02-05 MED ORDER — TETANUS-DIPHTH-ACELL PERTUSSIS 5-2.5-18.5 LF-MCG/0.5 IM SUSY
0.5000 mL | PREFILLED_SYRINGE | Freq: Once | INTRAMUSCULAR | Status: AC
  Administered 2024-02-05: 0.5 mL via INTRAMUSCULAR
  Filled 2024-02-05: qty 0.5

## 2024-02-05 NOTE — ED Provider Notes (Signed)
 [facility] Provider Note    Event Date/Time   First MD Initiated Contact with Patient 02/05/24 [phone]     (approximate)   History   Laceration   HPI  Catherine Marks is a 38 y.o. female with a past medical history of type 2 diabetes, hypertension, morbid obesity who presents today for evaluation of left hand injury.  Patient reports that she was lifting a pallet at work when it slid and hit her 3rd and 4th fingers.  No numbness or tingling.  She is able to flex and extend her fingers normally.  Unsure of last tetanus shot.  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   Plantar fasciitis 02/02/2016          Physical Exam   Triage Vital Signs: ED Triage Vitals  Encounter Vitals Group     BP 02/05/24 0806 100/65     Girls Systolic BP Percentile --      Girls Diastolic BP Percentile --      Boys Systolic BP Percentile --      Boys Diastolic BP Percentile --      Pulse Rate 02/05/24 0806 70     Resp 02/05/24 0806 16     Temp 02/05/24 0806 (!) 97.5 F (36.4 C)     Temp Source 02/05/24 0806 Oral     SpO2 02/05/24 0806 95 %     Weight 02/05/24 0809 265 lb (120.2 kg)     Height 02/05/24 0809 5' 3 (1.6 m)     Head Circumference --      Peak Flow --      Pain Score 02/05/24 0807 6     Pain Loc --      Pain Education --      Exclude from Growth Chart --     Most recent vital signs: Vitals:   02/05/24 0806 02/05/24 0939  BP: 100/65   Pulse: 70   Resp: 16   Temp: (!) 97.5 F (36.4 C)   SpO2: 95% 95%    Physical Exam Vitals and nursing note reviewed.  Constitutional:      General: Awake and alert. No acute distress.    Appearance: Normal  appearance.   HENT:     Head: Normocephalic and atraumatic.     Mouth: Mucous membranes are moist.  Eyes:     General: PERRL. Normal EOMs        Right eye: No discharge.        Left eye: No discharge.     Conjunctiva/sclera: Conjunctivae normal.  Cardiovascular:     Rate and Rhythm: Normal rate and regular rhythm.     Pulses: Normal pulses.  Pulmonary:     Effort: Pulmonary effort is normal. No respiratory distress.     Breath sounds: Normal breath sounds.  Abdominal:     Abdomen is soft. There is no abdominal tenderness. No rebound or guarding. No distention. Musculoskeletal:        General: No swelling. Normal range of motion.     Cervical back: Normal range of motion and neck supple.  Left hand: 1 cm laceration to 2nd and 3rd fingers over the dorsum of the proximal phalanx.  Mild  bleeding from the laceration to the second digit, superficial abrasion noted to the third digit.  No gaping.  No active bleeding.  Sensation intact light touch throughout.  No ecchymosis or swelling noted.  Able to flex and extend at isolated MCP, DIP, and PIP of affected fingers.  No palmar tenderness.  No bony tenderness. Skin:    General: Skin is warm and dry.     Capillary Refill: Capillary refill takes less than 2 seconds.     Findings: No rash.  Neurological:     Mental Status: The patient is awake and alert.      ED Results / Procedures / Treatments   Labs (all labs ordered are listed, but only abnormal results are displayed) Labs Reviewed - No data to display   EKG     RADIOLOGY I independently reviewed and interpreted imaging and agree with radiologists findings.     PROCEDURES:  Critical Care performed:   .Laceration Repair  Date/Time: 02/05/2024 1:03 PM  Performed by: Daran Favaro E, PA-C Authorized by: Daleon Willinger E, PA-C   Consent:    Consent obtained:  Verbal   Consent given by:  Patient   Risks, benefits, and alternatives were discussed: yes     Risks discussed:   Infection, need for additional repair, nerve damage, poor cosmetic result, pain, poor wound healing, tendon damage, retained foreign body and vascular damage   Alternatives discussed:  No treatment Universal protocol:    Procedure explained and questions answered to patient or proxy's satisfaction: yes     Relevant documents present and verified: yes     Test results available: yes     Imaging studies available: yes     Required blood products, implants, devices, and special equipment available: yes     Site/side marked: yes     Immediately prior to procedure, a time out was called: yes     Patient identity confirmed:  Verbally with patient Anesthesia:    Anesthesia method:  Topical application   Topical anesthetic:  LET Laceration details:    Location:  Finger   Finger location:  L index finger   Length (cm):  0.5   Depth (mm):  0.1 Pre-procedure details:    Preparation:  Patient was prepped and draped in usual sterile fashion and imaging obtained to evaluate for foreign bodies Exploration:    Limited defect created (wound extended): no     Hemostasis achieved with:  Direct pressure   Imaging obtained: x-ray     Imaging outcome: foreign body not noted     Wound exploration: wound explored through full range of motion and entire depth of wound visualized     Wound extent: areolar tissue not violated, fascia not violated, no foreign body, no signs of injury, no nerve damage, no tendon damage, no underlying fracture and no vascular damage     Contaminated: no   Treatment:    Area cleansed with:  Saline and chlorhexidine   Amount of cleaning:  Standard   Irrigation solution:  Sterile saline   Irrigation method:  Pressure wash   Visualized foreign bodies/material removed: no     Debridement:  None   Undermining:  None   Scar revision: no   Skin repair:    Repair method:  Tissue adhesive Repair type:    Repair type:  Simple Post-procedure details:    Dressing:  Sterile dressing  and splint for protection   Procedure completion:  Tolerated well, no immediate complications  MEDICATIONS ORDERED IN ED: Medications  Tdap (BOOSTRIX) injection 0.5 mL (0.5 mLs Intramuscular Given 02/05/24 0944)     IMPRESSION / MDM / ASSESSMENT AND PLAN / ED COURSE  I reviewed the triage vital signs and the nursing notes.   Differential diagnosis includes, but is not limited to, contusion, laceration, fracture.  Patient is awake and alert, hemodynamically stable and afebrile.  She has lacerations over the 3rd and 4th proximal phalanx on the dorsal side, though no active bleeding.  No bony tenderness, and x-ray is negative for any acute osseous injury.  Able to flex and extend at isolated MCP, DIP, and PIP against resistance.  I do not suspect tendon injury.  Her tetanus was updated given that she is unsure when her last tetanus shot was.  Wounds were cleaned and glue was applied per patient request.  Splinted was applied as well so that the wound does not reopen.  We discussed wound care and return precautions.  Patient understands and agrees with plan.  Discharged in stable condition.   Patient's presentation is most consistent with acute complicated illness / injury requiring diagnostic workup.    FINAL CLINICAL IMPRESSION(S) / ED DIAGNOSES   Final diagnoses:  Laceration of left index finger without foreign body without damage to nail, initial encounter  Laceration of left middle finger without foreign body without damage to nail, initial encounter     Rx / DC Orders   ED Discharge Orders     None        Note:  This document was prepared using Dragon voice recognition software and may include unintentional dictation errors.   Eldoris Beiser E, PA-C 02/05/24 1305    Levander Slate, MD 02/05/24 [phone]

## 2024-02-05 NOTE — ED Notes (Signed)
 The pt's employer over the phone that a drug screen was not needed, no paperwork was provided for the pt in ref to the worker's comp claim.

## 2024-02-05 NOTE — Discharge Instructions (Signed)
 You may remove the splint to wash your hands, and then replace it so that the wound does not reopen.  Please follow-up with your outpatient provider.  Please return for any new, worsening, or change in symptoms or other concerns.  It was a pleasure caring for you today.  Your tetanus was updated today.

## 2024-02-05 NOTE — ED Triage Notes (Signed)
 Pt arrives via POV with c/o laceration to the first 2 fingers on their left hand. Pt's hand was squished between a steel piece of equipment and a concrete and metal floor. Pt is A&Ox4 during triage.

## 2024-02-10 ENCOUNTER — Ambulatory Visit: Payer: BC Managed Care – PPO | Admitting: Family Medicine

## 2024-02-10 ENCOUNTER — Encounter: Payer: Self-pay | Admitting: Family Medicine

## 2024-02-10 VITALS — BP 108/68 | HR 74 | Ht 63.0 in | Wt 281.0 lb

## 2024-02-10 DIAGNOSIS — E1159 Type 2 diabetes mellitus with other circulatory complications: Secondary | ICD-10-CM

## 2024-02-10 DIAGNOSIS — E119 Type 2 diabetes mellitus without complications: Secondary | ICD-10-CM

## 2024-02-10 DIAGNOSIS — Z7985 Long-term (current) use of injectable non-insulin antidiabetic drugs: Secondary | ICD-10-CM

## 2024-02-10 DIAGNOSIS — R058 Other specified cough: Secondary | ICD-10-CM | POA: Diagnosis not present

## 2024-02-10 DIAGNOSIS — I152 Hypertension secondary to endocrine disorders: Secondary | ICD-10-CM

## 2024-02-10 MED ORDER — TIRZEPATIDE 12.5 MG/0.5ML ~~LOC~~ SOAJ: 12.5000 mg | SUBCUTANEOUS | 3 refills | Status: AC

## 2024-02-10 NOTE — Assessment & Plan Note (Signed)
 Chronic  Current BMI 49 Continue to follow up with bariatric surgery  Continue tirzepatide , increased to 12.5mg  weekly  Continue dietary management and physical activity

## 2024-02-10 NOTE — Assessment & Plan Note (Signed)
 Type 2 Diabetes Mellitus Type 2 diabetes is well controlled with an A1c of 5.7% five months ago. Current medication regimen is effective. She is on a weight management program in preparation for potential bariatric surgery. Mounjaro  dose increased due to stabilization of injection site reactions and potential for improved weight management. - Increase Mounjaro  (tirzepatide ) to 12.5 mg due to stabilization of injection site reactions - Recheck A1c in August 2025

## 2024-02-10 NOTE — Assessment & Plan Note (Signed)
 Persistent cough for six weeks following a sinus infection and COVID-19. Productive of clear mucus, with some brown mucus in the morning. Lungs are clear on examination, and oxygen saturation is 99%. Likely related to postnasal drip or post-infectious changes. - Order chest x-ray to evaluate for post-infectious changes or other abnormalities - Continue loratadine for postnasal drip

## 2024-02-10 NOTE — Assessment & Plan Note (Signed)
 Chronic Hypertension, well-controlled with current regimen.  BP 108/68 today  - Continue amlodipine  5 mg daily - Continue valsartan  160-hydrochlorothiazide  12.5 mg daily

## 2024-02-10 NOTE — Patient Instructions (Signed)
 Please report to [facility] located at:  [address]  [place], [place] [address]  You do not need an appointment to have xrays completed.   Our office will follow up with  results once available.

## 2024-02-10 NOTE — Progress Notes (Signed)
 Established patient visit   Patient: Catherine Marks   DOB: Mar 26, 1986   38 y.o. Female  MRN: [id number] Visit Date: 02/10/2024  Today's healthcare provider: Rockie Agent, MD   Chief Complaint  Patient presents with   Hypertension   Diabetes   Subjective       Discussed the use of AI scribe software for clinical note transcription with the patient, who gave verbal consent to proceed.  History of Present Illness Catherine Marks is a 38 year old female with hypertension and diabetes who presents for follow-up.  She is on a medication regimen that includes hydrocortisone 25 mg daily and amlodipine /valsartan  5/160 mg daily for hypertension. Her diabetes management includes Mounjaro  10 mg and Topamax 25 mg, which was started last month to help with sugar cravings, though she has not noticed a significant difference yet. She also uses a Mirena IUD for contraception.  She is on a bariatric surgery track and is participating in a six-month program that includes education, physical therapy, psychiatric care, and nutrition. This program is a prerequisite for her insurance to approve gastric sleeve surgery. Her BMI is currently around 52, and she reports having a significant amount of muscle mass, which may affect her BMI.  She has a persistent cough for the last six weeks, producing mucus, which started after a sinus infection and COVID-19 infection. She takes an over-the-counter allergy pill similar to Claritin daily. The cough is not excessive, occurring about once an hour, and is associated with clear mucus, except for brown mucus in the morning. She is concerned about the cough affecting her eligibility for bariatric surgery.  She recently cut her finger at work, which was treated in the ER with glue and a splint. She is concerned about tenderness and swelling in the finger, although imaging showed no fractures or foreign bodies. She changes the tape around the splint regularly and  is cautious about not constricting blood flow.  Her last metabolic panel five months ago was normal, and her A1c was well controlled at 5.7. She plans to recheck her A1c in August. No fever or chills. No significant issues with sleep or snoring.     Past Medical History:  Diagnosis Date   Allergy    Depression    Medical history non-contributory     Medications: Outpatient Medications Prior to Visit  Medication Sig   amLODipine -valsartan  (EXFORGE ) 5-160 MG tablet Take 1 tablet by mouth daily.   baclofen  (LIORESAL ) 10 MG tablet TAKE 1 TABLET BY MOUTH THREE TIMES A DAY   hydrochlorothiazide  (HYDRODIURIL ) 25 MG tablet Take 0.5 tablets (12.5 mg total) by mouth daily.   levonorgestrel (MIRENA) 20 MCG/DAY IUD 1 each by Intrauterine route once.   topiramate (TOPAMAX) 25 MG tablet Take by mouth.   [DISCONTINUED] tirzepatide  (MOUNJARO ) 10 MG/0.5ML Pen Inject 10 mg into the skin once a week.   No facility-administered medications prior to visit.    Review of Systems  Last metabolic panel Lab Results  Component Value Date   GLUCOSE 87 10/07/2023   NA 140 10/07/2023   K 4.3 10/07/2023   CL 100 10/07/2023   CO2 23 10/07/2023   BUN 16 10/07/2023   CREATININE 0.71 10/07/2023   EGFR 113 10/07/2023   CALCIUM 9.9 10/07/2023   PROT 7.1 10/07/2023   ALBUMIN 4.2 10/07/2023   LABGLOB 2.9 10/07/2023   AGRATIO 1.4 01/02/2023   BILITOT 0.5 10/07/2023   ALKPHOS 128 (H) 10/07/2023   AST 20 10/07/2023  ALT 22 10/07/2023   Last lipids Lab Results  Component Value Date   CHOL 181 10/07/2023   HDL 36 (L) 10/07/2023   LDLCALC 127 (H) 10/07/2023   TRIG 99 10/07/2023   CHOLHDL 5.0 (H) 10/07/2023   Last hemoglobin A1c Lab Results  Component Value Date   HGBA1C 5.7 (H) 10/07/2023        Objective    BP 108/68   Pulse 74   Ht 5' 3 (1.6 m)   Wt 281 lb (127.5 kg)   SpO2 99%   BMI 49.78 kg/m  BP Readings from Last 3 Encounters:  02/10/24 108/68  02/05/24 100/65  10/27/23 90/62    Wt Readings from Last 3 Encounters:  02/10/24 281 lb (127.5 kg)  02/05/24 265 lb (120.2 kg)  10/27/23 295 lb (133.8 kg)        Physical Exam  Physical Exam VITALS: P- 74, BP- 108/68, SaO2- 99% MEASUREMENTS: BMI- 52.0. HEENT: Tonsils enlarged, at least 2+. CHEST: Lungs clear to auscultation bilaterally.    No results found for any visits on 02/10/24.  Assessment & Plan     Problem List Items Addressed This Visit       Cardiovascular and Mediastinum   Hypertension associated with diabetes (HCC)   Chronic Hypertension, well-controlled with current regimen.  BP 108/68 today  - Continue amlodipine  5 mg daily - Continue valsartan  160-hydrochlorothiazide  12.5 mg daily      Relevant Medications   tirzepatide  (MOUNJARO ) 12.5 MG/0.5ML Pen     Endocrine   Type 2 diabetes mellitus without complication, without long-term current use of insulin (HCC) - Primary   Type 2 Diabetes Mellitus Type 2 diabetes is well controlled with an A1c of 5.7% five months ago. Current medication regimen is effective. She is on a weight management program in preparation for potential bariatric surgery. Mounjaro  dose increased due to stabilization of injection site reactions and potential for improved weight management. - Increase Mounjaro  (tirzepatide ) to 12.5 mg due to stabilization of injection site reactions - Recheck A1c in August 2025      Relevant Medications   tirzepatide  (MOUNJARO ) 12.5 MG/0.5ML Pen     Other   Productive cough   Persistent cough for six weeks following a sinus infection and COVID-19. Productive of clear mucus, with some brown mucus in the morning. Lungs are clear on examination, and oxygen saturation is 99%. Likely related to postnasal drip or post-infectious changes. - Order chest x-ray to evaluate for post-infectious changes or other abnormalities - Continue loratadine for postnasal drip      Relevant Orders   DG Chest 2 View   Morbid obesity (HCC)   Chronic   Current BMI 49 Continue to follow up with bariatric surgery  Continue tirzepatide , increased to 12.5mg  weekly  Continue dietary management and physical activity       Relevant Medications   tirzepatide  (MOUNJARO ) 12.5 MG/0.5ML Pen     Assessment & Plan Finger Laceration Laceration on the left hand from a work-related injury, treated in the ER with glue and a splint. Reports tenderness and swelling, but no signs of infection. X-ray showed no fractures or foreign bodies. - Loosen the tape on the splint to reduce constriction and improve comfort - Encourage icing the finger to reduce swelling        Return in about 6 weeks (around 03/23/2024) for DM,A1c.         Rockie Agent, MD  [facility] [phone] (phone) [phone] (fax)  [facility]

## 2024-02-11 ENCOUNTER — Telehealth: Payer: Self-pay

## 2024-02-11 ENCOUNTER — Other Ambulatory Visit (HOSPITAL_COMMUNITY): Payer: Self-pay

## 2024-02-11 NOTE — Telephone Encounter (Signed)
 Pharmacy Patient Advocate Encounter   Received notification from Physician's Office that prior authorization for Mounjaro  12.5mg /0.43ml Pen is required/requested.   Insurance verification completed.   The patient is insured through [employer] .   Per test claim: Refill too soon. PA is not needed at this time. Medication was filled 02/10/2024. Next eligible fill date is 04/14/2024.

## 2024-02-23 ENCOUNTER — Ambulatory Visit
Admission: RE | Admit: 2024-02-23 | Discharge: 2024-02-23 | Disposition: A | Discharge status: Home / Self Care | Source: Ambulatory Visit | Attending: Obstetrics and Gynecology

## 2024-02-23 ENCOUNTER — Ambulatory Visit
Admission: RE | Admit: 2024-02-23 | Discharge: 2024-02-23 | Disposition: A | Discharge status: Home / Self Care | Attending: Family Medicine | Admitting: Family Medicine

## 2024-02-23 DIAGNOSIS — T8332XA Displacement of intrauterine contraceptive device, initial encounter: Secondary | ICD-10-CM | POA: Diagnosis not present

## 2024-02-23 DIAGNOSIS — Z30431 Encounter for routine checking of intrauterine contraceptive device: Secondary | ICD-10-CM

## 2024-02-23 DIAGNOSIS — R058 Other specified cough: Secondary | ICD-10-CM

## 2024-02-23 DIAGNOSIS — Z6841 Body Mass Index (BMI) 40.0 and over, adult: Secondary | ICD-10-CM | POA: Diagnosis not present

## 2024-02-23 DIAGNOSIS — Z954 Presence of other heart-valve replacement: Secondary | ICD-10-CM | POA: Diagnosis not present

## 2024-02-23 DIAGNOSIS — F54 Psychological and behavioral factors associated with disorders or diseases classified elsewhere: Secondary | ICD-10-CM | POA: Diagnosis not present

## 2024-02-24 ENCOUNTER — Encounter: Payer: Self-pay | Admitting: Family Medicine

## 2024-02-24 ENCOUNTER — Ambulatory Visit: Admitting: Family Medicine

## 2024-02-24 ENCOUNTER — Ambulatory Visit: Payer: Self-pay | Admitting: Family Medicine

## 2024-02-24 VITALS — BP 104/73 | HR 79 | Ht 63.0 in | Wt 280.0 lb

## 2024-02-24 DIAGNOSIS — L309 Dermatitis, unspecified: Secondary | ICD-10-CM | POA: Diagnosis not present

## 2024-02-24 DIAGNOSIS — R058 Other specified cough: Secondary | ICD-10-CM | POA: Diagnosis not present

## 2024-02-24 MED ORDER — BENZONATATE 100 MG PO CAPS: 100.0000 mg | ORAL_CAPSULE | Freq: Two times a day (BID) | ORAL | 0 refills | Status: DC | PRN

## 2024-02-24 MED ORDER — TRIAMCINOLONE ACETONIDE 0.1 % EX OINT: 1.0000 | TOPICAL_OINTMENT | Freq: Two times a day (BID) | CUTANEOUS | 0 refills | Status: DC

## 2024-02-24 NOTE — Progress Notes (Signed)
 ACUTE VISIT   Patient: Catherine Marks   DOB: 03/26/1986   38 y.o. Female  MRN: [id number]   PCP: Sharma Coyer, MD  Chief Complaint  Patient presents with   Rash    On both thighs but more so on her L it s been there for about 4 days    Subjective    HPI HPI     Rash    Additional comments: On both thighs but more so on her L it s been there for about 4 days       Last edited by Thelbert Eulalio HERO, CMA on 02/24/2024  4:02 PM.       Discussed the use of AI scribe software for clinical note transcription with the patient, who gave verbal consent to proceed.  History of Present Illness Catherine Marks is a 38 year old female who presents with a rash on the posterior thighs.  She has had a rash on her posterior thighs for four days, described as itchy and primarily located on one side with some involvement on the other. Initially, she noticed a sensation of chafing, which she often experiences in the summer due to sweating. Her husband noted the rash was raised and appeared 'angry'.  She has tried various home treatments, including Neosporin, which temporarily soothes the itch, and calamine lotion, which did not help and possibly worsened the rash. She also used a fungal medication due to her [place] pig having ringworm, but it did not improve the rash. She has a history of psoriasis-like dry skin but feels this rash is different.  She takes an off-brand allergy medication daily for allergies. She showers in the mornings and takes a bath in the evenings. There have been no changes in her home products, and she has not traveled or visited water parks recently, although she did swim in a friend's regularly cleaned pool.  She mentions a persistent cough following a COVID-19 infection in mid-May, lasting three to four days. A recent chest x-ray was clear, but the cough persists. She avoids cough medicines due to drowsiness and takes baclofen  at night, which aids her sleep.  She reports that she does not usually get reflux.     Medications: Outpatient Medications Prior to Visit  Medication Sig   topiramate (TOPAMAX) 25 MG tablet Take by mouth. (Patient taking differently: Take by mouth. Twice a day)   amLODipine -valsartan  (EXFORGE ) 5-160 MG tablet Take 1 tablet by mouth daily.   baclofen  (LIORESAL ) 10 MG tablet TAKE 1 TABLET BY MOUTH THREE TIMES A DAY   hydrochlorothiazide  (HYDRODIURIL ) 25 MG tablet Take 0.5 tablets (12.5 mg total) by mouth daily.   levonorgestrel (MIRENA) 20 MCG/DAY IUD 1 each by Intrauterine route once.   tirzepatide  (MOUNJARO ) 12.5 MG/0.5ML Pen Inject 12.5 mg into the skin once a week.   No facility-administered medications prior to visit.        Objective    BP 104/73   Pulse 79   Ht 5' 3 (1.6 m)   Wt 280 lb (127 kg)   SpO2 99%   BMI 49.60 kg/m  BP Readings from Last 3 Encounters:  02/24/24 104/73  02/10/24 108/68  02/05/24 100/65   Wt Readings from Last 3 Encounters:  02/24/24 280 lb (127 kg)  02/10/24 281 lb (127.5 kg)  02/05/24 265 lb (120.2 kg)      Physical Exam Pulmonary:     Effort: Pulmonary effort is normal. No respiratory distress.  Breath sounds: No wheezing or rales.  Skin:          No results found for any visits on 02/24/24.  Assessment & Plan      Assessment & Plan Dermatitis Rash on the posterior thighs for four days, characterized by itchiness and raised, erythematous appearance. Differential diagnosis includes psoriasis, fungal infection, and intertrigo. Previous treatments with Neosporin, calamine lotion, and antifungal medication were ineffective. The location and characteristics suggest dermatitis. Triamcinolone  0.1% cream is recommended to manage symptoms and reduce inflammation. - Prescribe triamcinolone  0.1% cream to apply twice daily for up to two weeks - Advise to pat dry gently after bathing and apply cream before bed and in the morning - Instruct to discontinue use if rash  resolves before two weeks - Advise not to use triamcinolone  for more than two weeks at a time - Consider dermatology referral if symptoms persist  Chronic Cough Persistent cough following COVID-19 infection in mid-May, lasting approximately eight weeks. Chest x-ray was clear. Cough medicines have not been used due to drowsiness. Differential diagnosis includes post-viral cough and silent reflux. Tessalon  Perles are recommended to manage symptoms, with Prilosec as an alternative if ineffective. - Prescribe Tessalon  Perles 100 mcg to take twice daily as needed - Suggest trial of over-the-counter Prilosec if Tessalon  Perles are ineffective      No follow-ups on file.        Rockie Agent, MD  [facility] [phone] (phone) [phone] (fax)  [facility]

## 2024-03-01 ENCOUNTER — Ambulatory Visit: Payer: Self-pay | Admitting: Obstetrics and Gynecology

## 2024-03-05 ENCOUNTER — Encounter: Payer: Self-pay | Admitting: Family Medicine

## 2024-03-09 NOTE — Telephone Encounter (Signed)
 Looks like it is under media if that is the form you are referring to.  It can be printed from there if that is it.

## 2024-03-11 DIAGNOSIS — Z713 Dietary counseling and surveillance: Secondary | ICD-10-CM | POA: Diagnosis not present

## 2024-03-15 DIAGNOSIS — R5381 Other malaise: Secondary | ICD-10-CM | POA: Diagnosis not present

## 2024-03-15 DIAGNOSIS — R634 Abnormal weight loss: Secondary | ICD-10-CM | POA: Diagnosis not present

## 2024-03-15 DIAGNOSIS — E119 Type 2 diabetes mellitus without complications: Secondary | ICD-10-CM | POA: Diagnosis not present

## 2024-03-15 DIAGNOSIS — Z6841 Body Mass Index (BMI) 40.0 and over, adult: Secondary | ICD-10-CM | POA: Diagnosis not present

## 2024-03-15 DIAGNOSIS — E8881 Metabolic syndrome: Secondary | ICD-10-CM | POA: Diagnosis not present

## 2024-03-15 DIAGNOSIS — I10 Essential (primary) hypertension: Secondary | ICD-10-CM | POA: Diagnosis not present

## 2024-03-15 DIAGNOSIS — R9431 Abnormal electrocardiogram [ECG] [EKG]: Secondary | ICD-10-CM | POA: Diagnosis not present

## 2024-03-15 DIAGNOSIS — Z136 Encounter for screening for cardiovascular disorders: Secondary | ICD-10-CM | POA: Diagnosis not present

## 2024-03-15 DIAGNOSIS — Z01818 Encounter for other preprocedural examination: Secondary | ICD-10-CM | POA: Diagnosis not present

## 2024-04-01 ENCOUNTER — Encounter: Payer: Self-pay | Admitting: Family Medicine

## 2024-04-01 ENCOUNTER — Ambulatory Visit: Admitting: Family Medicine

## 2024-04-01 VITALS — BP 99/50 | HR 81 | Resp 16 | Ht 63.0 in | Wt 277.6 lb

## 2024-04-01 DIAGNOSIS — K219 Gastro-esophageal reflux disease without esophagitis: Secondary | ICD-10-CM

## 2024-04-01 DIAGNOSIS — E119 Type 2 diabetes mellitus without complications: Secondary | ICD-10-CM | POA: Diagnosis not present

## 2024-04-01 DIAGNOSIS — Z7985 Long-term (current) use of injectable non-insulin antidiabetic drugs: Secondary | ICD-10-CM

## 2024-04-01 DIAGNOSIS — E785 Hyperlipidemia, unspecified: Secondary | ICD-10-CM

## 2024-04-01 DIAGNOSIS — I152 Hypertension secondary to endocrine disorders: Secondary | ICD-10-CM | POA: Diagnosis not present

## 2024-04-01 DIAGNOSIS — E1159 Type 2 diabetes mellitus with other circulatory complications: Secondary | ICD-10-CM

## 2024-04-01 DIAGNOSIS — M542 Cervicalgia: Secondary | ICD-10-CM | POA: Diagnosis not present

## 2024-04-01 DIAGNOSIS — E1169 Type 2 diabetes mellitus with other specified complication: Secondary | ICD-10-CM

## 2024-04-01 MED ORDER — OMEPRAZOLE 20 MG PO CPDR: 20.0000 mg | DELAYED_RELEASE_CAPSULE | Freq: Every day | ORAL | 1 refills | Status: AC

## 2024-04-01 NOTE — Assessment & Plan Note (Signed)
 Chronic  Improved with baclofen  and regularly scheduled neck massages  Continue current regimen  Patient would like to stop taking the baclofen  in the near future

## 2024-04-01 NOTE — Assessment & Plan Note (Signed)
 Chronic Hypertension, well-controlled with current regimen.  BP hypotensive today 99/50 - patient will trial hold hydrochlorothiazide  12.5mg  daily and continue to monitor BP  - Continue valsartan  160 and amlodipine  5mg  daily

## 2024-04-01 NOTE — Assessment & Plan Note (Signed)
 Type 2 Diabetes Mellitus Chronic  Type 2 diabetes is well controlled with an A1c of 5.7% five months ago.  - on ARB, UTD on foot exam -needs updated eye exam  - Continue Mounjaro  (tirzepatide ) to 12.5 mg weekly  - Recheck A1c today

## 2024-04-01 NOTE — Assessment & Plan Note (Signed)
 Chronic  Obesity is being actively managed with lifestyle modifications, including increased physical activity and dietary changes. Significant weight loss achieved since last visit, contributing to improved blood pressure control. - Continue current lifestyle modifications and exercise regimen

## 2024-04-01 NOTE — Progress Notes (Signed)
 Established patient visit   Patient: Catherine Marks   DOB: 12-17-1985   38 y.o. Female  MRN: [id number] Visit Date: 04/01/2024  Today's healthcare provider: Rockie Agent, MD   Chief Complaint  Patient presents with   Follow-up    6 wk dm f/u Pt wants to discuss acid reflux   Subjective     HPI     Follow-up    Additional comments: 6 wk dm f/u Pt wants to discuss acid reflux      Last edited by Marylen Odella CROME, CMA on 04/01/2024 10:52 AM.       Discussed the use of AI scribe software for clinical note transcription with the patient, who gave verbal consent to proceed.  History of Present Illness Catherine Marks is a 38 year old female with type two diabetes, hypertension, and hyperlipidemia who presents with new concern for reflux symptoms.  She is experiencing new symptoms of reflux, including a persistent cough that did not improve after two weeks of using prescribed cough medicine. She started taking over-the-counter omeprazole  20 mg once daily before bed, which has helped alleviate her symptoms. She believes the reflux may be related to dietary changes, specifically a shift to a diet rich in whole foods and vegetables. She also mentions that her current medication, Mounjaro , might be contributing to her symptoms by slowing down digestion.  Regarding her hypertension, she is currently on amlodipine  5 mg, valsartan  160 mg, and hydrochlorothiazide  12.5 mg daily. Her blood pressure readings at home are typically around 110/80 mmHg. She does not feel symptomatic of low blood pressure and mentions that the reading might be artificially low due to the cuff placement. She has lost significant weight, from 345 pounds last May to approximately 278 pounds currently, which she believes has positively impacted her blood pressure.  She has been using baclofen  for neck pain but feels ready to discontinue it as she has not experienced pain recently. She has been on baclofen  for  nearly a year and prefers not to continue it indefinitely. Her other medication refills are sufficient until October.  She is preparing for bariatric surgery, anticipated around January, and is increasing her physical activity, attending the gym four days a week. She enjoys baking as a stress-relief activity and has a preference for eating bread, which she acknowledges as a dietary challenge.     Past Medical History:  Diagnosis Date   Allergy    Depression    Medical history non-contributory     Medications: Outpatient Medications Prior to Visit  Medication Sig   amLODipine -valsartan  (EXFORGE ) 5-160 MG tablet Take 1 tablet by mouth daily.   baclofen  (LIORESAL ) 10 MG tablet TAKE 1 TABLET BY MOUTH THREE TIMES A DAY   hydrochlorothiazide  (HYDRODIURIL ) 25 MG tablet Take 0.5 tablets (12.5 mg total) by mouth daily.   levonorgestrel (MIRENA) 20 MCG/DAY IUD 1 each by Intrauterine route once.   tirzepatide  (MOUNJARO ) 12.5 MG/0.5ML Pen Inject 12.5 mg into the skin once a week.   topiramate (TOPAMAX) 25 MG tablet Take 25 mg by mouth 2 (two) times daily.   [DISCONTINUED] benzonatate  (TESSALON ) 100 MG capsule Take 1 capsule (100 mg total) by mouth 2 (two) times daily as needed for cough.   [DISCONTINUED] triamcinolone  ointment (KENALOG ) 0.1 % Apply 1 Application topically 2 (two) times daily. Please use only for two weeks to prevent thinning of skin and color changes   No facility-administered medications prior to visit.    Review of Systems  Last metabolic panel Lab Results  Component Value Date   GLUCOSE 87 10/07/2023   NA 140 10/07/2023   K 4.3 10/07/2023   CL 100 10/07/2023   CO2 23 10/07/2023   BUN 16 10/07/2023   CREATININE 0.71 10/07/2023   EGFR 113 10/07/2023   CALCIUM 9.9 10/07/2023   PROT 7.1 10/07/2023   ALBUMIN 4.2 10/07/2023   LABGLOB 2.9 10/07/2023   AGRATIO 1.4 01/02/2023   BILITOT 0.5 10/07/2023   ALKPHOS 128 (H) 10/07/2023   AST 20 10/07/2023   ALT 22 10/07/2023    Last lipids Lab Results  Component Value Date   CHOL 181 10/07/2023   HDL 36 (L) 10/07/2023   LDLCALC 127 (H) 10/07/2023   TRIG 99 10/07/2023   CHOLHDL 5.0 (H) 10/07/2023   Last hemoglobin A1c Lab Results  Component Value Date   HGBA1C 5.7 (H) 10/07/2023        Objective    BP (!) 99/50 (BP Location: Right Arm, Patient Position: Sitting, Cuff Size: Normal)   Pulse 81   Resp 16   Ht 5' 3 (1.6 m)   Wt 277 lb 9.6 oz (125.9 kg)   SpO2 99%   BMI 49.17 kg/m   BP Readings from Last 3 Encounters:  04/01/24 (!) 99/50  02/24/24 104/73  02/10/24 108/68   Wt Readings from Last 3 Encounters:  04/01/24 277 lb 9.6 oz (125.9 kg)  02/24/24 280 lb (127 kg)  02/10/24 281 lb (127.5 kg)        Physical Exam Vitals reviewed.  Constitutional:      General: She is not in acute distress.    Appearance: Normal appearance. She is not ill-appearing.  Cardiovascular:     Rate and Rhythm: Normal rate and regular rhythm.  Pulmonary:     Effort: Pulmonary effort is normal. No respiratory distress.     Breath sounds: No wheezing, rhonchi or rales.  Neurological:     Mental Status: She is alert and oriented to person, place, and time.  Psychiatric:        Mood and Affect: Mood normal.        Behavior: Behavior normal.       No results found for any visits on 04/01/24.  Assessment & Plan     Problem List Items Addressed This Visit       Cardiovascular and Mediastinum   Hypertension associated with diabetes (HCC)   Chronic Hypertension, well-controlled with current regimen.  BP hypotensive today 99/50 - patient will trial hold hydrochlorothiazide  12.5mg  daily and continue to monitor BP  - Continue valsartan  160 and amlodipine  5mg  daily       Relevant Orders   BMP8+EGFR     Endocrine   Type 2 diabetes mellitus without complication, without long-term current use of insulin (HCC) - Primary   Type 2 Diabetes Mellitus Chronic  Type 2 diabetes is well controlled with an  A1c of 5.7% five months ago.  - on ARB, UTD on foot exam -needs updated eye exam  - Continue Mounjaro  (tirzepatide ) to 12.5 mg weekly  - Recheck A1c today       Relevant Orders   Hemoglobin A1c     Other   Neck pain, musculoskeletal   Chronic  Improved with baclofen  and regularly scheduled neck massages  Continue current regimen  Patient would like to stop taking the baclofen  in the near future       Morbid obesity (HCC)   Chronic  Obesity is being actively managed  with lifestyle modifications, including increased physical activity and dietary changes. Significant weight loss achieved since last visit, contributing to improved blood pressure control. - Continue current lifestyle modifications and exercise regimen      Other Visit Diagnoses       Hyperlipidemia associated with type 2 diabetes mellitus (HCC)       Relevant Orders   Lipid panel     Gastroesophageal reflux disease, unspecified whether esophagitis present       Relevant Medications   omeprazole  (PRILOSEC) 20 MG capsule       Assessment and Plan Assessment & Plan   Hyperlipidemia Chronic  Lipid panel will be checked to assess current status. - Order lipid panel  Gastroesophageal reflux disease (GERD) Chronic  New onset of GERD symptoms, likely related to dietary changes and medication (Mounjaro ). Symptoms improved with over-the-counter omeprazole . Discussed the cost-effectiveness of prescription omeprazole  versus over-the-counter options. - Prescribe omeprazole  20 mg once daily before bed        Return in about 7 months (around 10/30/2024) for DM, HTN.         Rockie Agent, MD  [facility] [phone] (phone) [phone] (fax)  [facility]

## 2024-04-02 ENCOUNTER — Ambulatory Visit: Payer: Self-pay | Admitting: Family Medicine

## 2024-04-02 LAB — BMP8+EGFR
BUN/Creatinine Ratio: 25 — ABNORMAL HIGH (ref 9–23)
BUN: 20 mg/dL (ref 6–20)
CO2: 22 mmol/L (ref 20–29)
Calcium: 9.8 mg/dL (ref 8.7–10.2)
Chloride: 99 mmol/L (ref 96–106)
Creatinine, Ser: 0.81 mg/dL (ref 0.57–1.00)
Glucose: 108 mg/dL — ABNORMAL HIGH (ref 70–99)
Potassium: 4 mmol/L (ref 3.5–5.2)
Sodium: 136 mmol/L (ref 134–144)
eGFR: 96 mL/min/1.73 (ref >59)

## 2024-04-02 LAB — LIPID PANEL
Chol/HDL Ratio: 5.3 ratio — ABNORMAL HIGH (ref 0.0–4.4)
Cholesterol, Total: 184 mg/dL (ref 100–199)
HDL: 35 mg/dL — ABNORMAL LOW (ref >39)
LDL Chol Calc (NIH): 124 mg/dL — ABNORMAL HIGH (ref 0–99)
Triglycerides: 139 mg/dL (ref 0–149)
VLDL Cholesterol Cal: 25 mg/dL (ref 5–40)

## 2024-04-02 LAB — HEMOGLOBIN A1C
Est. average glucose Bld gHb Est-mCnc: 111 mg/dL
Hgb A1c MFr Bld: 5.5 % (ref 4.8–5.6)

## 2024-04-06 MED ORDER — ROSUVASTATIN CALCIUM 5 MG PO TABS: 5.0000 mg | ORAL_TABLET | Freq: Every day | ORAL | 0 refills | Status: DC

## 2024-05-04 DIAGNOSIS — Z01818 Encounter for other preprocedural examination: Secondary | ICD-10-CM | POA: Diagnosis not present

## 2024-05-04 DIAGNOSIS — E785 Hyperlipidemia, unspecified: Secondary | ICD-10-CM | POA: Diagnosis not present

## 2024-05-04 DIAGNOSIS — I119 Hypertensive heart disease without heart failure: Secondary | ICD-10-CM | POA: Diagnosis not present

## 2024-05-04 DIAGNOSIS — I10 Essential (primary) hypertension: Secondary | ICD-10-CM | POA: Diagnosis not present

## 2024-05-04 DIAGNOSIS — E1169 Type 2 diabetes mellitus with other specified complication: Secondary | ICD-10-CM | POA: Diagnosis not present

## 2024-05-05 DIAGNOSIS — E119 Type 2 diabetes mellitus without complications: Secondary | ICD-10-CM | POA: Diagnosis not present

## 2024-05-05 DIAGNOSIS — Z01818 Encounter for other preprocedural examination: Secondary | ICD-10-CM | POA: Diagnosis not present

## 2024-05-05 DIAGNOSIS — E8881 Metabolic syndrome: Secondary | ICD-10-CM | POA: Diagnosis not present

## 2024-05-05 DIAGNOSIS — I10 Essential (primary) hypertension: Secondary | ICD-10-CM | POA: Diagnosis not present

## 2024-05-05 DIAGNOSIS — Z6841 Body Mass Index (BMI) 40.0 and over, adult: Secondary | ICD-10-CM | POA: Diagnosis not present

## 2024-05-13 DIAGNOSIS — Z01818 Encounter for other preprocedural examination: Secondary | ICD-10-CM | POA: Diagnosis not present

## 2024-05-13 DIAGNOSIS — I119 Hypertensive heart disease without heart failure: Secondary | ICD-10-CM | POA: Diagnosis not present

## 2024-05-22 ENCOUNTER — Other Ambulatory Visit: Payer: Self-pay | Admitting: Family Medicine

## 2024-05-22 DIAGNOSIS — I10 Essential (primary) hypertension: Secondary | ICD-10-CM

## 2024-05-25 DIAGNOSIS — Z713 Dietary counseling and surveillance: Secondary | ICD-10-CM | POA: Diagnosis not present

## 2024-05-27 ENCOUNTER — Other Ambulatory Visit: Payer: Self-pay | Admitting: Family Medicine

## 2024-05-27 DIAGNOSIS — I10 Essential (primary) hypertension: Secondary | ICD-10-CM

## 2024-06-15 ENCOUNTER — Encounter: Payer: Self-pay | Admitting: Family Medicine

## 2024-06-15 DIAGNOSIS — Z6841 Body Mass Index (BMI) 40.0 and over, adult: Secondary | ICD-10-CM | POA: Diagnosis not present

## 2024-06-15 DIAGNOSIS — E66813 Obesity, class 3: Secondary | ICD-10-CM | POA: Diagnosis not present

## 2024-06-15 DIAGNOSIS — I10 Essential (primary) hypertension: Secondary | ICD-10-CM | POA: Diagnosis not present

## 2024-06-15 DIAGNOSIS — Z01818 Encounter for other preprocedural examination: Secondary | ICD-10-CM | POA: Diagnosis not present

## 2024-06-29 ENCOUNTER — Other Ambulatory Visit: Payer: Self-pay | Admitting: Family Medicine

## 2024-07-12 DIAGNOSIS — I10 Essential (primary) hypertension: Secondary | ICD-10-CM | POA: Diagnosis not present

## 2024-07-12 DIAGNOSIS — E119 Type 2 diabetes mellitus without complications: Secondary | ICD-10-CM | POA: Diagnosis not present

## 2024-07-12 DIAGNOSIS — E8881 Metabolic syndrome: Secondary | ICD-10-CM | POA: Diagnosis not present

## 2024-07-17 ENCOUNTER — Other Ambulatory Visit: Payer: Self-pay | Admitting: Family Medicine

## 2024-07-17 DIAGNOSIS — M542 Cervicalgia: Secondary | ICD-10-CM

## 2024-08-01 ENCOUNTER — Encounter: Payer: Self-pay | Admitting: Family Medicine

## 2024-08-09 ENCOUNTER — Ambulatory Visit: Admitting: Family Medicine

## 2024-08-10 DIAGNOSIS — E119 Type 2 diabetes mellitus without complications: Secondary | ICD-10-CM | POA: Diagnosis not present

## 2024-08-10 DIAGNOSIS — I10 Essential (primary) hypertension: Secondary | ICD-10-CM | POA: Diagnosis not present

## 2024-08-10 DIAGNOSIS — E8881 Metabolic syndrome: Secondary | ICD-10-CM | POA: Diagnosis not present

## 2024-11-01 ENCOUNTER — Ambulatory Visit: Admitting: Family Medicine
